# Patient Record
Sex: Female | Born: 1950 | Hispanic: No | Marital: Married | Smoking: Never smoker
Health system: Southern US, Community
[De-identification: ages and names within clinical notes are randomized; demographics above are authoritative.]

## PROBLEM LIST (undated history)

## (undated) DIAGNOSIS — R0683 Snoring: Secondary | ICD-10-CM

## (undated) DIAGNOSIS — J45909 Unspecified asthma, uncomplicated: Secondary | ICD-10-CM

## (undated) DIAGNOSIS — Z91018 Allergy to other foods: Secondary | ICD-10-CM

## (undated) DIAGNOSIS — L718 Other rosacea: Secondary | ICD-10-CM

## (undated) DIAGNOSIS — K219 Gastro-esophageal reflux disease without esophagitis: Secondary | ICD-10-CM

## (undated) DIAGNOSIS — J309 Allergic rhinitis, unspecified: Secondary | ICD-10-CM

## (undated) HISTORY — DX: Unspecified asthma, uncomplicated: J45.909

## (undated) HISTORY — DX: Allergic rhinitis, unspecified: J30.9

## (undated) HISTORY — DX: Allergy to other foods: Z91.018

## (undated) HISTORY — PX: TONSILLECTOMY: SUR1361

## (undated) HISTORY — PX: ADENOIDECTOMY: SUR15

## (undated) HISTORY — DX: Gastro-esophageal reflux disease without esophagitis: K21.9

## (undated) HISTORY — DX: Other rosacea: L71.8

## (undated) HISTORY — DX: Snoring: R06.83

---

## 1972-02-11 HISTORY — PX: APPENDECTOMY: SHX54

## 2000-02-11 HISTORY — PX: SINOSCOPY: SHX187

## 2001-02-10 HISTORY — PX: KNEE SURGERY: SHX244

## 2009-09-14 ENCOUNTER — Encounter (INDEPENDENT_AMBULATORY_CARE_PROVIDER_SITE_OTHER): Payer: Self-pay | Admitting: Emergency Medicine

## 2009-09-14 ENCOUNTER — Ambulatory Visit: Payer: Self-pay | Admitting: Vascular Surgery

## 2009-09-14 ENCOUNTER — Emergency Department (HOSPITAL_COMMUNITY): Admission: EM | Admit: 2009-09-14 | Discharge: 2009-09-14 | Payer: Self-pay | Admitting: Emergency Medicine

## 2009-12-05 ENCOUNTER — Ambulatory Visit (HOSPITAL_COMMUNITY)
Admission: RE | Admit: 2009-12-05 | Discharge: 2009-12-05 | Payer: Self-pay | Source: Home / Self Care | Admitting: Obstetrics and Gynecology

## 2010-02-10 HISTORY — PX: ABDOMINAL HYSTERECTOMY: SHX81

## 2010-03-11 LAB — CBC
MCH: 29.2 pg (ref 26.0–34.0)
MCHC: 33.1 g/dL (ref 30.0–36.0)
MCV: 88.2 fL (ref 78.0–100.0)
Platelets: 301 10*3/uL (ref 150–400)
RDW: 12.5 % (ref 11.5–15.5)

## 2010-03-18 ENCOUNTER — Ambulatory Visit (HOSPITAL_COMMUNITY)
Admission: RE | Admit: 2010-03-18 | Discharge: 2010-03-19 | Disposition: A | Discharge status: Home / Self Care | Payer: Medicare HMO | Source: Ambulatory Visit | Attending: Obstetrics and Gynecology | Admitting: Obstetrics and Gynecology

## 2010-03-18 ENCOUNTER — Other Ambulatory Visit (HOSPITAL_COMMUNITY): Payer: Self-pay | Admitting: Obstetrics and Gynecology

## 2010-03-18 DIAGNOSIS — N920 Excessive and frequent menstruation with regular cycle: Secondary | ICD-10-CM | POA: Insufficient documentation

## 2010-03-18 DIAGNOSIS — N8000 Endometriosis of the uterus, unspecified: Secondary | ICD-10-CM | POA: Insufficient documentation

## 2010-03-18 DIAGNOSIS — J45909 Unspecified asthma, uncomplicated: Secondary | ICD-10-CM | POA: Insufficient documentation

## 2010-03-18 DIAGNOSIS — N946 Dysmenorrhea, unspecified: Secondary | ICD-10-CM | POA: Insufficient documentation

## 2010-03-18 DIAGNOSIS — N83209 Unspecified ovarian cyst, unspecified side: Secondary | ICD-10-CM | POA: Insufficient documentation

## 2010-03-19 LAB — CBC
HCT: 27.4 % — ABNORMAL LOW (ref 36.0–46.0)
Hemoglobin: 9 g/dL — ABNORMAL LOW (ref 12.0–15.0)
MCV: 89 fL (ref 78.0–100.0)
Platelets: 228 10*3/uL (ref 150–400)
RBC: 3.08 MIL/uL — ABNORMAL LOW (ref 3.87–5.11)
WBC: 9.5 10*3/uL (ref 4.0–10.5)

## 2010-03-21 NOTE — Op Note (Signed)
Misty Scott, Misty Scott                ACCOUNT NO.:  [id number]  MEDICAL RECORD NO.:  [id number]           PATIENT TYPE:  I  LOCATION:  9320                          FACILITY:  [facility]  PHYSICIAN:  Zelphia Cairo, MD    DATE OF BIRTH:  [birth year]  DATE OF PROCEDURE:  03/18/2010 DATE OF DISCHARGE:                              OPERATIVE REPORT   PREOPERATIVE DIAGNOSES: 1. Menorrhagia. 2. Dysmenorrhea.  POSTOPERATIVE DIAGNOSES: 1. Menorrhagia 2. Dysmenorrhea. 3. Path pending.  PROCEDURE:  Laparoscopic-assisted vaginal hysterectomy.  SURGEON:  Zelphia Cairo, MD  ASSISTANT:  Dineen Kid. Rana Snare, MD  ANESTHESIA:  General.  URINE OUTPUT:  Clear.  ESTIMATED BLOOD LOSS:  300 mL.  COMPLICATIONS:  None.  CONDITION:  Stable to recovery room.  PROCEDURES:  Misty Scott was taken to the operating room with IV running. She was placed in the supine position where general anesthesia was obtained.  She was placed in the dorsal lithotomy position using Allen stirrups and prepped and draped in sterile fashion.  A Foley catheter was inserted sterilely.  A bivalve speculum was placed in the vagina and a single-tooth tenaculum placed on the anterior lip of the cervix.  A Hulka clamp was placed through the cervix and attached to the anterior lip.  Single-tooth tenaculum and speculum removed and our attention was turned to the abdomen.  After injection of local anesthesia, an infraumbilical skin incision was made with the scalpel.  This was extended bluntly to the level of the fascia using a Kelly clamp.  Optical trocar was inserted under direct visualization and CO2 was turned on.  The abdomen and pelvis were insufflated.  A survey of the abdomen and pelvis revealed of boggy- appearing uterus without masses.  Bilateral fallopian tubes and ovaries appeared normal.  No ovarian cysts or masses were noted bilaterally. The patient was placed in Trendelenburg position and the bowel moved out of the  posterior cul-de-sac.  The uterus was manipulated to the patient's left side.  The enseal device was used to grasp, cauterize, and cut the right fallopian tube.  The right utero-ovarian ligament and broad ligament were then grasped, cut, and cauterized using the EnSeal. This incision was extended staying just adjacent to the uterus.  The round ligament was grasped, cauterized, and cut with the EnSeal.  Once hemostasis was assured on the right, this procedure was repeated on the left side.  The left fallopian tube, utero-ovarian ligament, and broad ligament to the level of the round ligament were grasped, cut, cauterized with the EnSeal device.  Good hemostasis was noted bilaterally.  All instruments were removed from the trocar.  CO2 was turned off and our attention was turned to the vagina.  A Hulka clamp was removed and a weighted speculum was placed in the vagina.  A Deaver was placed in the anterior vagina and the cervix was grasped with a tenaculum.  The cervix was then circumferentially incised with the Bovie and the bladder was dissected off the pubovesical cervical fascia anteriorly using a dry and moist lap sponge.  The posterior cul-de-sac was entered sharply using curved Mayo scissors and a long-weighted speculum  was placed in the posterior cul-de-sac.  Heaney clamps were placed over the uterosacral ligaments bilaterally.  These were then transected and suture ligated with 0 Monocryl.  Hemostasis was assured.  Cardinal ligaments were then clamped bilaterally, transected, and suture ligated in similar fashion.  Uterine arteries and broad ligaments were then serially clamped with Heaney clamps, transected, and suture ligated bilaterally.  Excellent hemostasis was visualized.  The cornua and uterus were then delivered through the posterior cul-de-sac. Any remaining pedicles were grasped, cut, and suture ligated.  The posterior vaginal cuff was then reapproximated with the  posterior peritoneum using a running locked stitch.  Once hemostasis was assured, the long-weighted speculum was traded for the short-weighted speculum and the remainder of the vaginal cuff was closed using figure-of-eight stitches in an interrupted fashion.  All instruments were removed from the vagina and our attention was then returned to the abdomen.  The CO2 was turned back on and all pedicles and the vaginal cuff were reinspected and noted to be hemostatic.  No evidence of bleeding or clots were noted within the pelvis.  The trocar and instruments were removed from the abdominal incision.  A deep stitch was placed in the infraumbilical incision and the skin was reapproximated using Vicryl in an interrupted stitch.  Dermabond was placed over the abdominal incision.  The patient tolerated the procedure well.  Sponge, lap, instrument, and needle counts were correct x2.  She was taken to the recovery room in stable condition.     Zelphia Cairo, MD     GA/MEDQ  D:  03/18/2010  T:  03/19/2010  Job:  [id number]  Electronically Signed by Zelphia Cairo MD on 03/21/2010 01:30:15 PM

## 2010-04-24 LAB — CBC
HCT: 32.9 % — ABNORMAL LOW (ref 36.0–46.0)
Hemoglobin: 11.5 g/dL — ABNORMAL LOW (ref 12.0–15.0)
MCH: 34.2 pg — ABNORMAL HIGH (ref 26.0–34.0)
MCHC: 35.1 g/dL (ref 30.0–36.0)
MCV: 97.4 fL (ref 78.0–100.0)
Platelets: 229 K/uL (ref 150–400)
RBC: 3.38 MIL/uL — ABNORMAL LOW (ref 3.87–5.11)
RDW: 13.7 % (ref 11.5–15.5)
WBC: 6.7 K/uL (ref 4.0–10.5)

## 2011-08-25 ENCOUNTER — Other Ambulatory Visit: Payer: Self-pay | Admitting: Gastroenterology

## 2011-08-25 DIAGNOSIS — R109 Unspecified abdominal pain: Secondary | ICD-10-CM

## 2011-08-26 ENCOUNTER — Ambulatory Visit
Admission: RE | Admit: 2011-08-26 | Discharge: 2011-08-26 | Disposition: A | Discharge status: Home / Self Care | Payer: Medicare HMO | Source: Ambulatory Visit | Attending: Gastroenterology | Admitting: Gastroenterology

## 2011-08-26 DIAGNOSIS — R109 Unspecified abdominal pain: Secondary | ICD-10-CM

## 2011-08-26 MED ORDER — IOHEXOL 300 MG/ML  SOLN
100.0000 mL | Freq: Once | INTRAMUSCULAR | Status: AC | PRN
  Administered 2011-08-26: 100 mL via INTRAVENOUS

## 2012-08-24 ENCOUNTER — Other Ambulatory Visit: Payer: Self-pay | Admitting: Obstetrics and Gynecology

## 2012-08-24 DIAGNOSIS — N63 Unspecified lump in unspecified breast: Secondary | ICD-10-CM

## 2012-08-24 DIAGNOSIS — N644 Mastodynia: Secondary | ICD-10-CM

## 2012-09-02 ENCOUNTER — Ambulatory Visit
Admission: RE | Admit: 2012-09-02 | Discharge: 2012-09-02 | Disposition: A | Discharge status: Home / Self Care | Payer: Medicare HMO | Source: Ambulatory Visit | Attending: Obstetrics and Gynecology | Admitting: Obstetrics and Gynecology

## 2012-09-02 ENCOUNTER — Other Ambulatory Visit: Payer: Self-pay | Admitting: Obstetrics and Gynecology

## 2012-09-02 DIAGNOSIS — N63 Unspecified lump in unspecified breast: Secondary | ICD-10-CM

## 2012-09-02 DIAGNOSIS — N644 Mastodynia: Secondary | ICD-10-CM

## 2013-09-20 ENCOUNTER — Other Ambulatory Visit: Payer: Self-pay | Admitting: Ophthalmology

## 2013-09-20 DIAGNOSIS — H5340 Unspecified visual field defects: Secondary | ICD-10-CM

## 2013-09-27 ENCOUNTER — Other Ambulatory Visit: Payer: Medicare HMO

## 2013-12-07 ENCOUNTER — Ambulatory Visit
Admission: RE | Admit: 2013-12-07 | Discharge: 2013-12-07 | Disposition: A | Discharge status: Home / Self Care | Payer: 59 | Source: Ambulatory Visit | Attending: Ophthalmology | Admitting: Ophthalmology

## 2013-12-07 DIAGNOSIS — H5340 Unspecified visual field defects: Secondary | ICD-10-CM

## 2014-09-21 ENCOUNTER — Other Ambulatory Visit: Payer: Self-pay | Admitting: Obstetrics and Gynecology

## 2014-09-22 LAB — CYTOLOGY - PAP

## 2014-10-02 ENCOUNTER — Other Ambulatory Visit: Payer: Self-pay | Admitting: Obstetrics and Gynecology

## 2014-10-02 DIAGNOSIS — N6489 Other specified disorders of breast: Secondary | ICD-10-CM

## 2014-10-02 DIAGNOSIS — R198 Other specified symptoms and signs involving the digestive system and abdomen: Secondary | ICD-10-CM

## 2014-10-02 DIAGNOSIS — N644 Mastodynia: Secondary | ICD-10-CM

## 2014-10-05 ENCOUNTER — Other Ambulatory Visit: Payer: 59

## 2014-10-06 ENCOUNTER — Other Ambulatory Visit: Payer: Self-pay | Admitting: Obstetrics and Gynecology

## 2014-10-06 ENCOUNTER — Ambulatory Visit
Admission: RE | Admit: 2014-10-06 | Discharge: 2014-10-06 | Disposition: A | Discharge status: Home / Self Care | Payer: Managed Care, Other (non HMO) | Source: Ambulatory Visit | Attending: Obstetrics and Gynecology | Admitting: Obstetrics and Gynecology

## 2014-10-06 DIAGNOSIS — N6489 Other specified disorders of breast: Secondary | ICD-10-CM

## 2014-10-06 DIAGNOSIS — R198 Other specified symptoms and signs involving the digestive system and abdomen: Secondary | ICD-10-CM

## 2014-10-06 DIAGNOSIS — N644 Mastodynia: Secondary | ICD-10-CM

## 2015-02-14 ENCOUNTER — Encounter: Payer: Self-pay | Admitting: Allergy and Immunology

## 2015-02-14 ENCOUNTER — Ambulatory Visit (INDEPENDENT_AMBULATORY_CARE_PROVIDER_SITE_OTHER): Payer: Managed Care, Other (non HMO) | Admitting: Allergy and Immunology

## 2015-02-14 VITALS — BP 122/70 | HR 72 | Resp 20

## 2015-02-14 DIAGNOSIS — J387 Other diseases of larynx: Secondary | ICD-10-CM | POA: Diagnosis not present

## 2015-02-14 DIAGNOSIS — J4520 Mild intermittent asthma, uncomplicated: Secondary | ICD-10-CM | POA: Diagnosis not present

## 2015-02-14 DIAGNOSIS — K219 Gastro-esophageal reflux disease without esophagitis: Secondary | ICD-10-CM | POA: Insufficient documentation

## 2015-02-14 DIAGNOSIS — J3089 Other allergic rhinitis: Secondary | ICD-10-CM | POA: Diagnosis not present

## 2015-02-14 DIAGNOSIS — R0683 Snoring: Secondary | ICD-10-CM | POA: Diagnosis not present

## 2015-02-14 DIAGNOSIS — Z91018 Allergy to other foods: Secondary | ICD-10-CM

## 2015-02-14 MED ORDER — BECLOMETHASONE DIPROPIONATE 80 MCG/ACT IN AERS: INHALATION_SPRAY | RESPIRATORY_TRACT | Status: DC

## 2015-02-14 MED ORDER — RANITIDINE HCL 300 MG PO CAPS: 300.0000 mg | ORAL_CAPSULE | Freq: Every evening | ORAL | Status: DC

## 2015-02-14 MED ORDER — OMEPRAZOLE 40 MG PO CPDR: 40.0000 mg | DELAYED_RELEASE_CAPSULE | Freq: Every day | ORAL | Status: DC

## 2015-02-14 NOTE — Progress Notes (Signed)
[facility]  Follow-up Note  Referring Provider: No ref. provider found Primary Provider: Horatio Pel, MD Date of Office Visit: 02/14/2015  Subjective:   Misty Scott is a 65 y.o. female who returns to the [facility] in re-evaluation of the following:  HPI Comments:  Misty Scott returns to this clinic on 02/14/2015 in reevaluation of her LPR, allergic rhinitis, intermittent asthma, snoring, and possible food allergy. Overall she's had a good 6 month interval since I've last seen her in his clinic. She's had very good control of her reflux disease while currently using her medical therapy and she's had very little problems with her throat and very little problems with her nose while using nasal fluticasone. She's lost a possibly 11 pounds of weight. This late fall for about 6 weeks she did have problems with nocturnal asthma with wheezing and coughing for which she had use of bronchodilator almost every day. Fortunately, over the course of the past 4 weeks this has resolved. She does not have a history of exercise-induced bronchospastic symptoms. There was no obvious trigger for this flareup. There did not appear to be any viral respiratory tract infection involving her upper airways associated with this flare. Her snoring appears to be stable and there is no associated apneic symptoms associated with her snoring.   No current outpatient prescriptions on file prior to visit.   No current facility-administered medications on file prior to visit.    Meds ordered this encounter  Medications  . ranitidine (ZANTAC) 300 MG capsule    Sig: Take 1 capsule (300 mg total) by mouth every evening.    Dispense:  30 capsule    Refill:  5  . omeprazole (PRILOSEC) 40 MG capsule    Sig: Take 1 capsule (40 mg total) by mouth daily.    Dispense:  30 capsule    Refill:  5    No past medical history on file.  No past  surgical history on file.  Allergies  Allergen Reactions  . Avelox [Moxifloxacin Hcl In Nacl]   . Nsaids     Review of systems negative except as noted in HPI / PMHx or noted below:  Review of Systems  Constitutional: Negative.   HENT: Negative.   Eyes: Negative.   Respiratory: Negative.   Cardiovascular: Negative.   Gastrointestinal: Negative.   Genitourinary: Negative.   Musculoskeletal: Negative.   Skin: Negative.   Neurological: Negative.   Endo/Heme/Allergies: Negative.   Psychiatric/Behavioral: Negative.      Objective:   Filed Vitals:   02/14/15 1128  BP: 122/70  Pulse: 72  Resp: 20          Physical Exam  Constitutional: She is well-developed, well-nourished, and in no distress. No distress.  HENT:  Head: Normocephalic.  Right Ear: Tympanic membrane, external ear and ear canal normal.  Left Ear: Tympanic membrane, external ear and ear canal normal.  Nose: Nose normal. No mucosal edema or rhinorrhea.  Mouth/Throat: Uvula is midline, oropharynx is clear and moist and mucous membranes are normal. No oropharyngeal exudate.  Eyes: Conjunctivae are normal.  Neck: Trachea normal. No tracheal tenderness present. No tracheal deviation present. No thyromegaly present.  Cardiovascular: Normal rate, regular rhythm, S1 normal, S2 normal and normal heart sounds.   No murmur heard. Pulmonary/Chest: Breath sounds normal. No stridor. No respiratory distress. She has no wheezes. She has no rales.  Musculoskeletal: She exhibits no edema.  Lymphadenopathy:  Head (right side): No tonsillar adenopathy present.       Head (left side): No tonsillar adenopathy present.    She has no cervical adenopathy.    She has no axillary adenopathy.  Neurological: She is alert. Gait normal.  Skin: No rash noted. She is not diaphoretic. No erythema. Nails show no clubbing.  Psychiatric: Mood and affect normal.    Diagnostics:    Spirometry was performed and demonstrated an FEV1  of 2.96 at 134 % of predicted.   Assessment and Plan:   1. Mild intermittent asthma, uncomplicated   2. LPRD (laryngopharyngeal reflux disease)   3. Other allergic rhinitis   4. History of food allergy   5. Snoring      1. Continue combination of omeprazole 40 mg in a.m. plus ranitidine 300 mg in PM  2. Continue nasal fluticasone one-2 sprays each nostril daily  3. Continue Proventil HFA 2 puffs every 4-6 hours if needed  4. "Action plan" for asthma flare up:   A. start Qvar 80 3 inhalations 3 times per day with spacer  B. continue Proventil HFA 2 puffs every 4-6 hours if needed  5. Continue EpiPen if needed  6. Continue weight loss and exercise program  7. Return to clinic in 6 months or earlier if problem  Overall Misty Scott is done quite well and we'll continue to have her use the therapy mentioned above with the addition of an action plan that she can initiate whenever she develops an asthma flare in the future. I think that if she continues to undergo weight loss program and exercise program she's going to do much better overall as we move through the next 6 months. She will contact me during the interval should there be a significant problem but otherwise we'll just regroup with each other in 6 months.  Allena Katz, MD [facility]

## 2015-02-14 NOTE — Patient Instructions (Signed)
  1. Continue combination of omeprazole 40 mg in a.m. plus ranitidine 300 mg in PM  2. Continue nasal fluticasone one-2 sprays each nostril daily  3. Continue Proventil HFA 2 puffs every 4-6 hours if needed  4. "Action plan" for asthma flare up:   A. start Qvar 80 3 inhalations 3 times per day with spacer  B. continue Proventil HFA 2 puffs every 4-6 hours if needed  5. Continue EpiPen if needed  6. Continue weight loss and exercise program  7. Return to clinic in 6 months or earlier if problem

## 2015-04-23 ENCOUNTER — Other Ambulatory Visit: Payer: Self-pay | Admitting: *Deleted

## 2015-04-23 MED ORDER — OMEPRAZOLE 40 MG PO CPDR: 40.0000 mg | DELAYED_RELEASE_CAPSULE | Freq: Every day | ORAL | Status: AC

## 2015-04-23 MED ORDER — BECLOMETHASONE DIPROPIONATE 80 MCG/ACT IN AERS: INHALATION_SPRAY | RESPIRATORY_TRACT | Status: DC

## 2015-07-02 ENCOUNTER — Other Ambulatory Visit: Payer: Self-pay

## 2015-07-02 MED ORDER — RANITIDINE HCL 300 MG PO CAPS: 300.0000 mg | ORAL_CAPSULE | Freq: Every evening | ORAL | Status: AC

## 2015-07-04 ENCOUNTER — Telehealth: Payer: Self-pay

## 2015-07-04 ENCOUNTER — Other Ambulatory Visit: Payer: Self-pay

## 2015-07-04 NOTE — Telephone Encounter (Signed)
Faxed one 90-Day refill of Ranitidine 300mg - [facility] to [facility].

## 2015-07-04 NOTE — Telephone Encounter (Signed)
Faxed one 90-day refill for Ranitidine 300mg - [facility] to [facility].

## 2015-08-22 ENCOUNTER — Ambulatory Visit: Payer: Managed Care, Other (non HMO) | Admitting: Allergy and Immunology

## 2015-09-25 ENCOUNTER — Encounter (INDEPENDENT_AMBULATORY_CARE_PROVIDER_SITE_OTHER): Payer: Self-pay

## 2015-09-25 ENCOUNTER — Ambulatory Visit (INDEPENDENT_AMBULATORY_CARE_PROVIDER_SITE_OTHER): Payer: Managed Care, Other (non HMO) | Admitting: Allergy and Immunology

## 2015-09-25 ENCOUNTER — Encounter: Payer: Self-pay | Admitting: Allergy and Immunology

## 2015-09-25 VITALS — BP 122/84 | HR 60 | Resp 20 | Ht 60.67 in | Wt 159.6 lb

## 2015-09-25 DIAGNOSIS — Z91018 Allergy to other foods: Secondary | ICD-10-CM

## 2015-09-25 DIAGNOSIS — J4520 Mild intermittent asthma, uncomplicated: Secondary | ICD-10-CM | POA: Diagnosis not present

## 2015-09-25 DIAGNOSIS — J387 Other diseases of larynx: Secondary | ICD-10-CM

## 2015-09-25 DIAGNOSIS — R0683 Snoring: Secondary | ICD-10-CM

## 2015-09-25 DIAGNOSIS — J3089 Other allergic rhinitis: Secondary | ICD-10-CM

## 2015-09-25 DIAGNOSIS — K219 Gastro-esophageal reflux disease without esophagitis: Secondary | ICD-10-CM

## 2015-09-25 MED ORDER — EPINEPHRINE 0.3 MG/0.3ML IJ SOAJ: INTRAMUSCULAR | 3 refills | Status: AC

## 2015-09-25 NOTE — Progress Notes (Signed)
Follow-up Note  Referring Provider: Deland Pretty, MD Primary Provider: Horatio Pel, MD Date of Office Visit: 09/25/2015  Subjective:   Misty Scott (DOB: [birth year]) is a 65 y.o. female who returns to the [facility] on 09/25/2015 in re-evaluation of the following:  HPI: Misty Scott returns to this clinic in reevaluation of her allergic rhinitis, intermittent asthma, reflux-induced respiratory disease, and history of food allergy directed again shellfish. I last saw her in his clinic in January 2017  During the interval she's had no problems with her asthma at all. She can exercise without any difficulty. She has lost a total of 21 pounds since last August. She does not need to use a short acting bronchodilator and has not had to activate an action plan and has not required a systemic steroid.  Her upper airways are doing very well. She uses Flonase every day. She is not required an antibiotic to treat an episode of sinusitis.  She's had very little problems with reflux while consistently using a combination of a proton pump inhibitor and H2 receptor blocker. She has eliminated consumption of all red wine but still has one glass of Braxton wine most nights and still has 1 cup of coffee per day.  She remains away from shellfish.  Her snoring has decreased dramatically since she has lost 21 pounds of weight    Medication List      aspirin 81 MG tablet Take 81 mg by mouth daily.   beclomethasone 80 MCG/ACT inhaler Commonly known as:  QVAR Use 3 puffs 3 times as needed for asthma flare ups.   EPIPEN 2-PAK 0.3 mg/0.3 mL Soaj injection Generic drug:  EPINEPHrine Inject into the muscle once.   Fish Oil 500 MG Caps Take by mouth.   fluticasone 50 MCG/ACT nasal spray Commonly known as:  FLONASE Place 2 sprays into both nostrils daily.   lubiprostone 8 MCG capsule Commonly known as:  AMITIZA Take 8 mcg by mouth 2 (two) times daily with a meal.     omeprazole 40 MG capsule Commonly known as:  PRILOSEC Take 1 capsule (40 mg total) by mouth daily.   PROVENTIL HFA 108 (90 Base) MCG/ACT inhaler Generic drug:  albuterol Inhale 2 puffs into the lungs every 4 (four) hours as needed for wheezing or shortness of breath.   ranitidine 300 MG capsule Commonly known as:  ZANTAC Take 1 capsule (300 mg total) by mouth every evening.   Vitamin D-3 1000 units Caps Take by mouth.       Past Medical History:  Diagnosis Date  . Allergic rhinitis   . Asthma   . Food allergy    Shellfish  . LPRD (laryngopharyngeal reflux disease)   . Ocular rosacea   . Snoring     Past Surgical History:  Procedure Laterality Date  . ABDOMINAL HYSTERECTOMY  2012  . ADENOIDECTOMY    . APPENDECTOMY  1974  . CESAREAN SECTION  1987  . KNEE SURGERY Left 2003  . SINOSCOPY  2002  . TONSILLECTOMY      Allergies  Allergen Reactions  . Avelox [Moxifloxacin Hcl In Nacl]   . Nsaids   . Tolmetin Other (See Comments)    Review of systems negative except as noted in HPI / PMHx or noted below:  Review of Systems  Constitutional: Negative.   HENT: Negative.   Eyes: Negative.   Respiratory: Negative.   Cardiovascular: Negative.   Gastrointestinal: Negative.   Genitourinary: Negative.  Musculoskeletal: Negative.   Skin: Negative.   Neurological: Negative.   Endo/Heme/Allergies: Negative.   Psychiatric/Behavioral: Negative.      Objective:   Vitals:   09/25/15 1400  BP: 122/84  Pulse: 60  Resp: 20   Height: 5' 0.67" (154.1 cm)  Weight: 159 lb 9.6 oz (72.4 kg)   Physical Exam  Constitutional: She is well-developed, well-nourished, and in no distress.  HENT:  Head: Normocephalic.  Right Ear: Tympanic membrane, external ear and ear canal normal.  Left Ear: Tympanic membrane, external ear and ear canal normal.  Nose: Nose normal. No mucosal edema or rhinorrhea.  Mouth/Throat: Uvula is midline, oropharynx is clear and moist and mucous  membranes are normal. No oropharyngeal exudate.  Eyes: Conjunctivae are normal.  Neck: Trachea normal. No tracheal tenderness present. No tracheal deviation present. No thyromegaly present.  Cardiovascular: Normal rate, regular rhythm, S1 normal, S2 normal and normal heart sounds.   No murmur heard. Pulmonary/Chest: Breath sounds normal. No stridor. No respiratory distress. She has no wheezes. She has no rales.  Musculoskeletal: She exhibits no edema.  Lymphadenopathy:       Head (right side): No tonsillar adenopathy present.       Head (left side): No tonsillar adenopathy present.    She has no cervical adenopathy.  Neurological: She is alert. Gait normal.  Skin: No rash noted. She is not diaphoretic. No erythema. Nails show no clubbing.  Psychiatric: Mood and affect normal.    Diagnostics:    Spirometry was performed and demonstrated an FEV1 of 2.93 at 138 % of predicted.  The patient had an Asthma Control Test with the following results:  .    Assessment and Plan:   1. Mild intermittent asthma, uncomplicated   2. Other allergic rhinitis   3. History of food allergy   4. LPRD (laryngopharyngeal reflux disease)   5. Snoring     1. Continue combination of omeprazole 40 mg in a.m. plus ranitidine 300 mg in PM  2. Continue nasal fluticasone one-2 sprays each nostril daily  3. Continue Proventil HFA 2 puffs every 4-6 hours if needed  4. "Action plan" for asthma flare up:   A. start Qvar 80 3 inhalations 3 times per day with spacer  B. continue Proventil HFA 2 puffs every 4-6 hours if needed  5. Continue EpiPen and if needed  6. Continue weight loss and exercise program  7. Return to clinic in 1 year or earlier if problem  8. Obtain fall flu vaccine  Misty Scott is doing quite well at this point in time most likely because she is careful about her exposures, has lost a fair amount of weight the past year, and has been consistently using medical therapy directed against reflux  and upper airway inflammation. At this point we'll continue to have her use the therapy mentioned above and she can activate an action plan should she ever develop an asthma flare in the future and can also use EpiPen if she ever develops another allergic reaction in the future. I will see her back in this clinic in 1 year or earlier if there is a problem.  Allena Katz, MD [facility]

## 2015-09-25 NOTE — Patient Instructions (Addendum)
  1. Continue combination of omeprazole 40 mg in a.m. plus ranitidine 300 mg in PM  2. Continue nasal fluticasone one-2 sprays each nostril daily  3. Continue Proventil HFA 2 puffs every 4-6 hours if needed  4. "Action plan" for asthma flare up:   A. start Qvar 80 3 inhalations 3 times per day with spacer  B. continue Proventil HFA 2 puffs every 4-6 hours if needed  5. Continue EpiPen and if needed  6. Continue weight loss and exercise program  7. Return to clinic in 1 year or earlier if problem  8. Obtain fall flu vaccine

## 2015-12-25 ENCOUNTER — Other Ambulatory Visit: Payer: Self-pay | Admitting: Allergy and Immunology

## 2016-08-14 ENCOUNTER — Other Ambulatory Visit: Payer: Self-pay | Admitting: *Deleted

## 2016-08-14 MED ORDER — FLUTICASONE PROPIONATE HFA 110 MCG/ACT IN AERO: INHALATION_SPRAY | RESPIRATORY_TRACT | 0 refills | Status: AC

## 2016-12-23 IMAGING — MG MM DIAG BREAST TOMO UNI LEFT
5 series · 6 of 13 positions shown · non-contrast
Comparison: Previous exam(s).

CLINICAL DATA: 64-year-old female with an area of palpable concern
in the upper-outer left breast.

EXAM:
DIGITAL DIAGNOSTIC LEFT MAMMOGRAM WITH 3D TOMOSYNTHESIS AND CAD
LEFT BREAST ULTRASOUND

[L TAN]
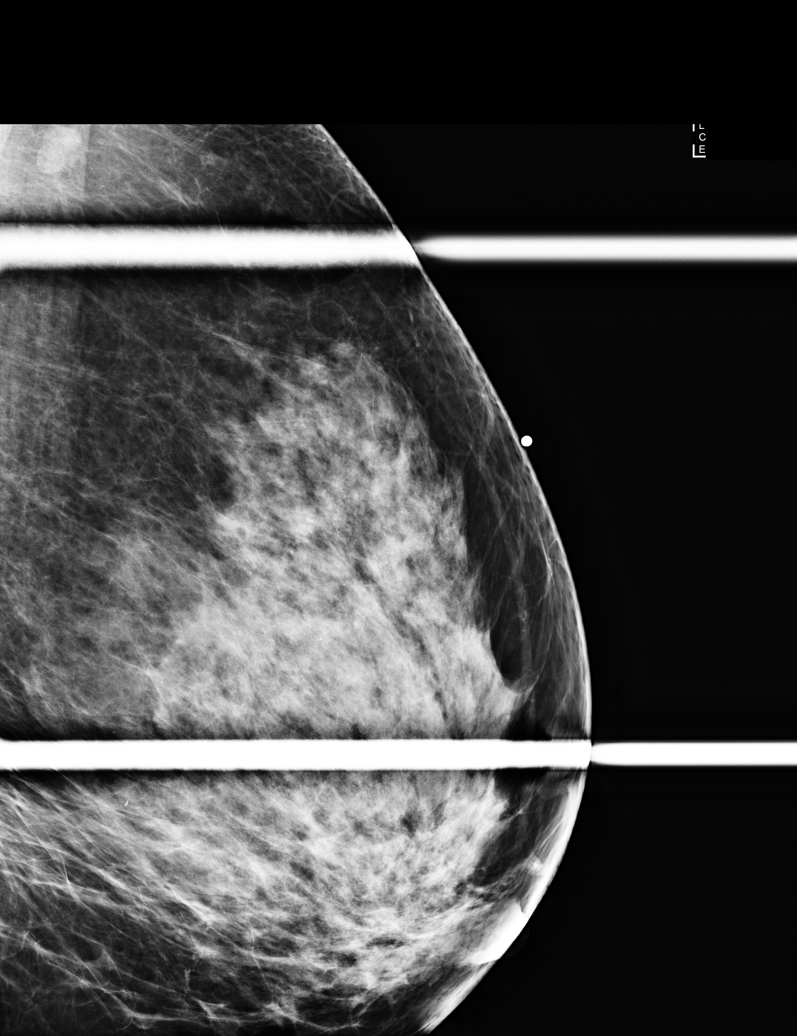

[L CC]
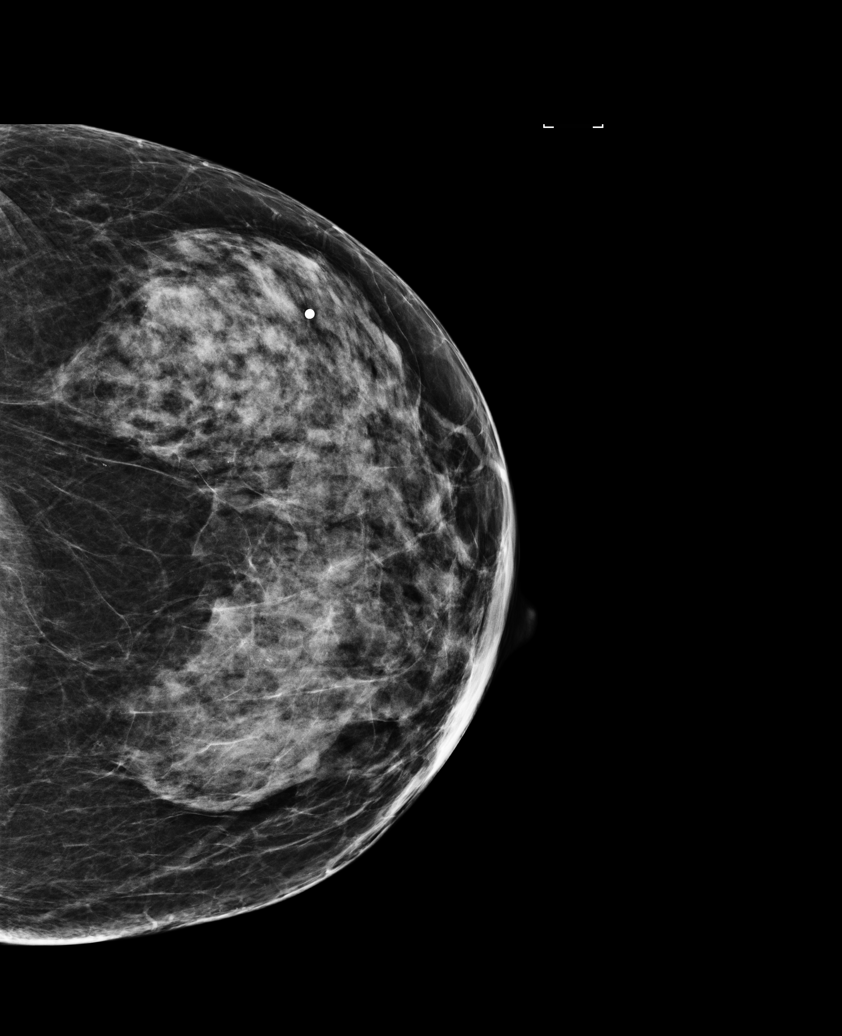

[L MLO]
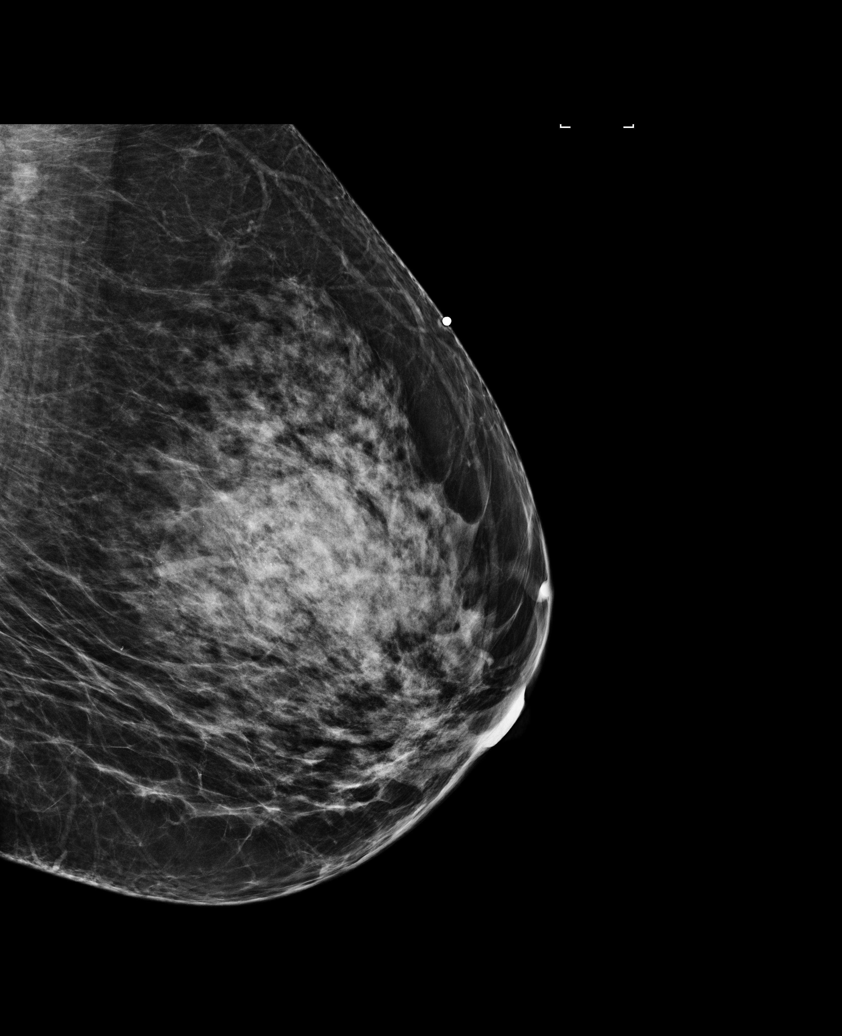

[L MLO tomo · 2 of 71 frames shown]
[frame 23/71]
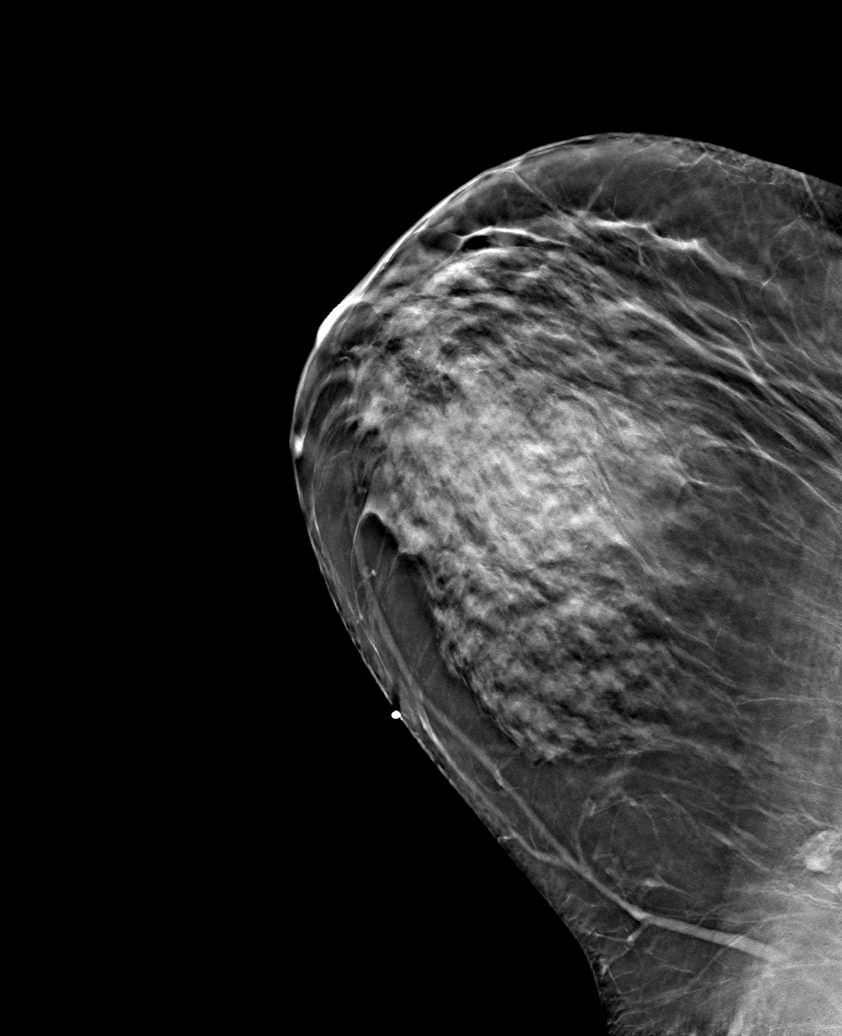
[frame 36/71]
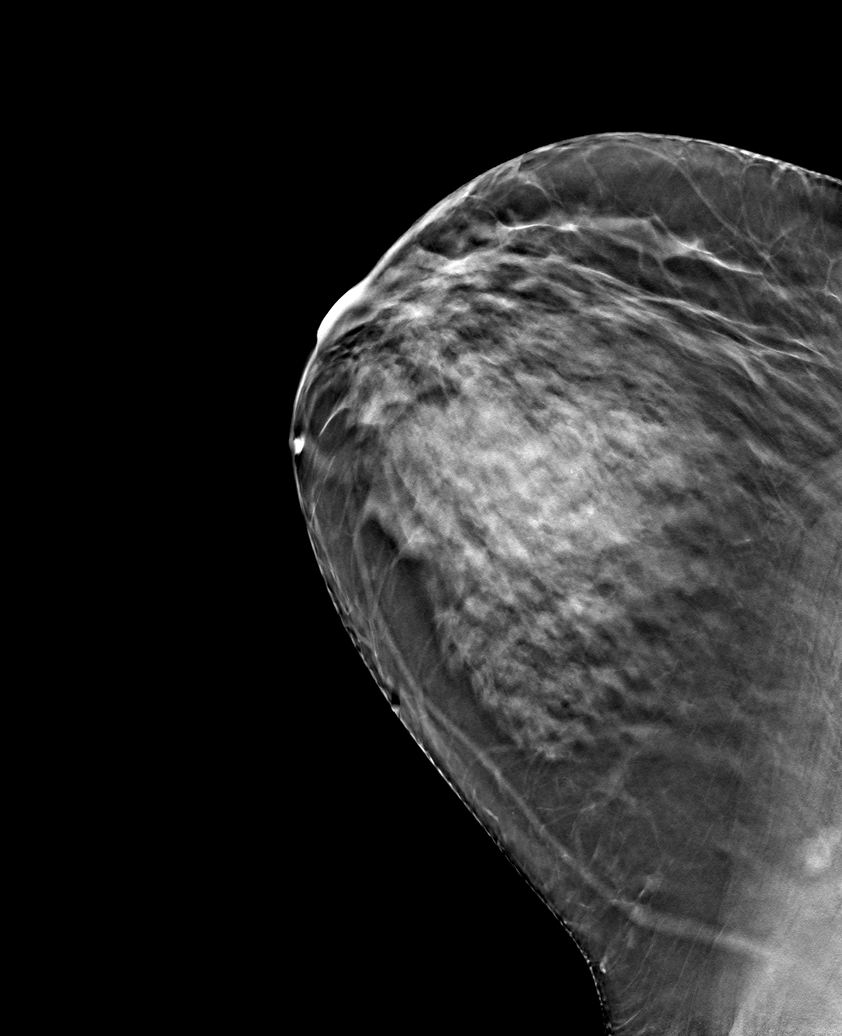

[L CC tomo · tomo slice 35/70.0]
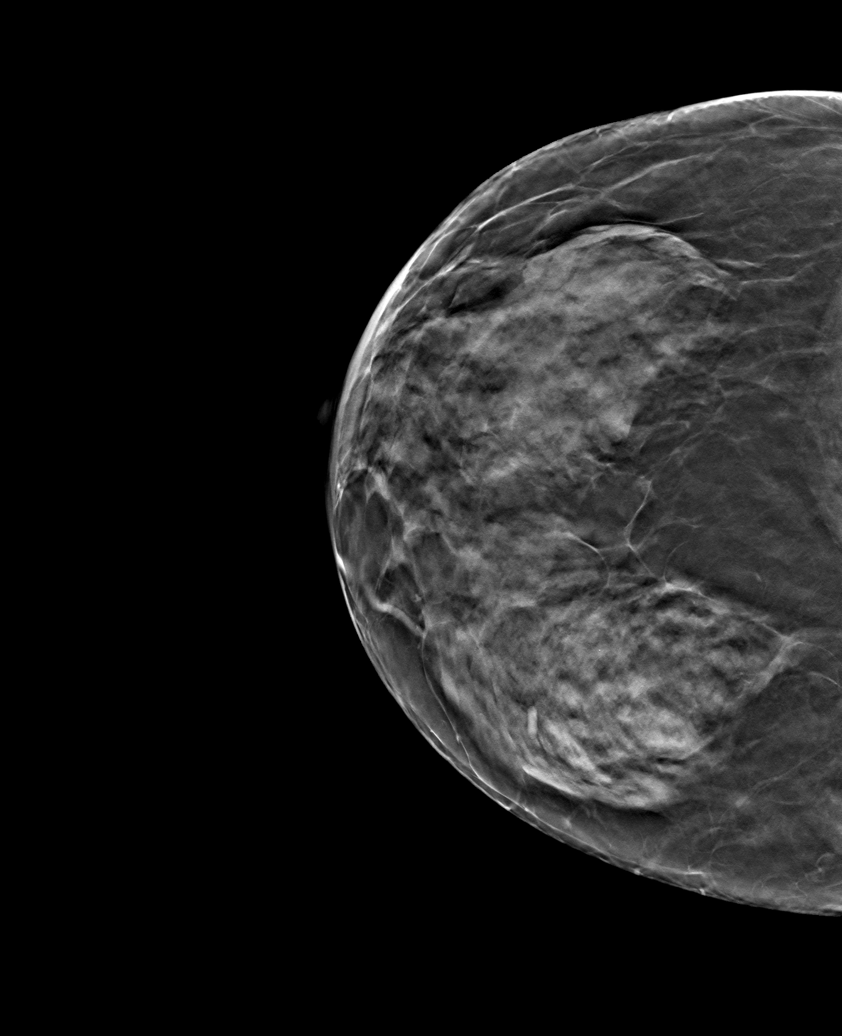

[6 of 13 positions shown; findings below may reference images not displayed]

ACR Breast Density Category c: The breast tissue is heterogeneously
dense, which may obscure small masses.
FINDINGS: No suspicious masses or calcifications are seen in the left breast.
Spot compression tangential view was performed over the palpable
site of concern in the upper-outer left breast with no mammographic
abnormalities seen in this location.

Mammographic images were processed with CAD.

Physical examination at site of concern in the upper-outer left
breast reveals a generalized area of thickening.

Targeted ultrasound of the left breast was performed. No discrete
masses or abnormalities are seen, only extremely dense
fibroglandular tissue is visualized.
IMPRESSION: 1. No mammographic or sonographic correlate for the area of palpable
concern in the upper-outer left breast.

2. No mammographic evidence of malignancy in the left breast.

RECOMMENDATION:
1. Recommend further management of the left breast palpable
abnormality be based on clinical assessment.

2.  Screening mammogram in one year.(Code:NL-H-V10)

I have discussed the findings and recommendations with the patient.
Results were also provided in writing at the conclusion of the
visit. If applicable, a reminder letter will be sent to the patient
regarding the next appointment.

BI-RADS CATEGORY  1: Negative.

## 2017-01-07 ENCOUNTER — Other Ambulatory Visit: Payer: Self-pay

## 2017-01-07 ENCOUNTER — Emergency Department (HOSPITAL_COMMUNITY)
Admission: EM | Admit: 2017-01-07 | Discharge: 2017-01-07 | Disposition: A | Discharge status: Home / Self Care | Payer: Managed Care, Other (non HMO) | Attending: Emergency Medicine | Admitting: Emergency Medicine

## 2017-01-07 ENCOUNTER — Encounter (HOSPITAL_COMMUNITY): Payer: Self-pay | Admitting: *Deleted

## 2017-01-07 ENCOUNTER — Emergency Department (HOSPITAL_COMMUNITY): Payer: Managed Care, Other (non HMO)

## 2017-01-07 DIAGNOSIS — R0789 Other chest pain: Secondary | ICD-10-CM

## 2017-01-07 DIAGNOSIS — Z79899 Other long term (current) drug therapy: Secondary | ICD-10-CM | POA: Insufficient documentation

## 2017-01-07 DIAGNOSIS — Z7982 Long term (current) use of aspirin: Secondary | ICD-10-CM | POA: Diagnosis not present

## 2017-01-07 DIAGNOSIS — J4520 Mild intermittent asthma, uncomplicated: Secondary | ICD-10-CM | POA: Diagnosis not present

## 2017-01-07 LAB — CBC
HEMATOCRIT: 42.8 % (ref 36.0–46.0)
Hemoglobin: 14.8 g/dL (ref 12.0–15.0)
MCH: 32.7 pg (ref 26.0–34.0)
MCHC: 34.6 g/dL (ref 30.0–36.0)
MCV: 94.5 fL (ref 78.0–100.0)
Platelets: 250 10*3/uL (ref 150–400)
RBC: 4.53 MIL/uL (ref 3.87–5.11)
RDW: 13 % (ref 11.5–15.5)
WBC: 6.2 10*3/uL (ref 4.0–10.5)

## 2017-01-07 LAB — HEPATIC FUNCTION PANEL
ALT: 18 U/L (ref 14–54)
AST: 18 U/L (ref 15–41)
Albumin: 3.5 g/dL (ref 3.5–5.0)
Alkaline Phosphatase: 78 U/L (ref 38–126)
BILIRUBIN DIRECT: 0.2 mg/dL (ref 0.1–0.5)
BILIRUBIN INDIRECT: 0.4 mg/dL (ref 0.3–0.9)
BILIRUBIN TOTAL: 0.6 mg/dL (ref 0.3–1.2)
Total Protein: 5.7 g/dL — ABNORMAL LOW (ref 6.5–8.1)

## 2017-01-07 LAB — I-STAT TROPONIN, ED
TROPONIN I, POC: 0.01 ng/mL (ref 0.00–0.08)
Troponin i, poc: 0 ng/mL (ref 0.00–0.08)

## 2017-01-07 LAB — BASIC METABOLIC PANEL
Anion gap: 8 (ref 5–15)
BUN: 13 mg/dL (ref 6–20)
CHLORIDE: 102 mmol/L (ref 101–111)
CO2: 25 mmol/L (ref 22–32)
Calcium: 9.6 mg/dL (ref 8.9–10.3)
Creatinine, Ser: 0.69 mg/dL (ref 0.44–1.00)
GFR calc Af Amer: >60 mL/min (ref >60)
GFR calc non Af Amer: >60 mL/min (ref >60)
GLUCOSE: 146 mg/dL — AB (ref 65–99)
POTASSIUM: 3.5 mmol/L (ref 3.5–5.1)
Sodium: 135 mmol/L (ref 135–145)

## 2017-01-07 LAB — I-STAT CG4 LACTIC ACID, ED: LACTIC ACID, VENOUS: 0.79 mmol/L (ref 0.5–1.9)

## 2017-01-07 LAB — D-DIMER, QUANTITATIVE: D-Dimer, Quant: <0.27 ug/mL-FEU (ref 0.00–0.50)

## 2017-01-07 LAB — LIPASE, BLOOD: Lipase: 31 U/L (ref 11–51)

## 2017-01-07 MED ORDER — FAMOTIDINE 20 MG PO TABS: 20.0000 mg | ORAL_TABLET | Freq: Two times a day (BID) | ORAL | 0 refills | Status: AC

## 2017-01-07 NOTE — ED Triage Notes (Addendum)
PT had a follow up appointment with Dr Einar Gip scheduled for 4 pm today to talk about results from a cardiac stress test with dye she had performed yesterday.  Now c/o L sided chest pressure.  Also c/o indigestion.  Airplane flight 2 weeks prior.

## 2017-01-07 NOTE — Discharge Instructions (Addendum)
Your troponin enzymes were normal x2, your d-dimer was not elevated, your liver function enzymes and your lipase were normal. Your chest x-ray was unremarkable. Take Pepcid regularly for 1 week. Follow-up with your cardiologist for further evaluation. Return to ED for worsening chest pain, shortness of breath, coughing up blood, leg swelling or loss of consciousness.

## 2017-01-07 NOTE — ED Notes (Signed)
ED Provider at bedside. 

## 2017-01-07 NOTE — ED Provider Notes (Signed)
[facility] EMERGENCY DEPARTMENT Provider Note   CSN: [id number] Arrival date & time: 01/07/17  1313     History   Chief Complaint Chief Complaint  Patient presents with  . Chest Pain    HPI Misty Scott is a 66 y.o. female with past medical history of asthma, who presents to ED for evaluation 7-hour history of-central chest pain.  She reports that it feels like "pressure" and unlike any pain that she has had before.  She did complete a cardiac stress test 2 days ago and was on her way to a follow-up appointment with her cardiologist when she began having the pain.  She also reports intermittent shortness of breath but is unsure if this is due to her anxiety from the pain.  States that the pain has subsided since being here in the emergency room.  She reports recent prolonged travel earlier in the month to [place] and then recently to [place].  She denies any leg swelling, hemoptysis, fevers, wheezing, prior DVT, PE, MI, productive cough, URI symptoms.  HPI  Past Medical History:  Diagnosis Date  . Allergic rhinitis   . Asthma   . Food allergy    Shellfish  . LPRD (laryngopharyngeal reflux disease)   . Ocular rosacea   . Snoring     Patient Active Problem List   Diagnosis Date Noted  . Mild intermittent asthma 02/14/2015  . LPRD (laryngopharyngeal reflux disease) 02/14/2015  . Other allergic rhinitis 02/14/2015  . History of food allergy 02/14/2015  . Snoring 02/14/2015    Past Surgical History:  Procedure Laterality Date  . ABDOMINAL HYSTERECTOMY  2012  . ADENOIDECTOMY    . APPENDECTOMY  1974  . CESAREAN SECTION  1987  . KNEE SURGERY Left 2003  . SINOSCOPY  2002  . TONSILLECTOMY      OB History    No data available       Home Medications    Prior to Admission medications   Medication Sig Start Date End Date Taking? Authorizing Provider  albuterol (PROVENTIL HFA) 108 (90 Base) MCG/ACT inhaler Inhale 2 puffs into the lungs every 4  (four) hours as needed for wheezing or shortness of breath.   Yes [provider]  beclomethasone (QVAR REDIHALER) 40 MCG/ACT inhaler Inhale 2 puffs into the lungs 2 (two) times daily.   Yes [provider]  Aloe-Sodium Chloride (AYR SALINE NASAL GEL NA) Place into the nose as needed.    [provider]  aspirin 81 MG tablet Take 81 mg by mouth daily.    [provider]  Cholecalciferol (VITAMIN D-3) 1000 units CAPS Take 1,000 Units by mouth daily.     [provider]  Diclofenac Sodium (PENNSAID) 2 % SOLN PLACE 2 PUMPS ON SKIN 2 TIMES DAILY 08/21/15   [provider]  EPINEPHrine (EPIPEN 2-PAK) 0.3 mg/0.3 mL IJ SOAJ injection Use as directed for life-threatening allergic reaction. 09/25/15   Kozlow, Donnamarie Poag, MD  famotidine (PEPCID) 20 MG tablet Take 1 tablet (20 mg total) by mouth 2 (two) times daily. 01/07/17   Jacky Hartung, PA-C  fluticasone (FLONASE) 50 MCG/ACT nasal spray Place 2 sprays into both nostrils daily.    [provider]  fluticasone (FLOVENT HFA) 110 MCG/ACT inhaler Use 3 puffs 3 times daily as directed for asthma flareup symptoms 08/14/16   Kozlow, Donnamarie Poag, MD  ibuprofen (ADVIL,MOTRIN) 800 MG tablet Take by mouth as needed.    [provider]  lubiprostone Baker Pierini)  8 MCG capsule Take 8 mcg by mouth 2 (two) times daily with a meal.    [provider]  montelukast (SINGULAIR) 10 MG tablet Take 10 mg by mouth at bedtime.    [provider]  Olopatadine HCl 0.2 % SOLN Apply to eye.    [provider]  Omega-3 Fatty Acids (FISH OIL) 500 MG CAPS Take by mouth.    [provider]  omeprazole (PRILOSEC) 40 MG capsule Take 1 capsule (40 mg total) by mouth daily. 04/23/15   Kozlow, Donnamarie Poag, MD  PENNSAID 2 % SOLN  08/21/15   [provider]  Propylene Glycol (SYSTANE BALANCE OP) Apply 2 drops to eye 2 (two) times daily.    [provider]  ranitidine (ZANTAC) 300 MG capsule  Take 1 capsule (300 mg total) by mouth every evening. 07/02/15   Kozlow, Donnamarie Poag, MD  Simethicone (GAS-X PO) Take by mouth as needed.    [provider]    Family History No family history on file.  Social History Social History   Tobacco Use  . Smoking status: Never Smoker  . Smokeless tobacco: Never Used  Substance Use Topics  . Alcohol use: Yes    Alcohol/week: 0.0 oz  . Drug use: No     Allergies   Avelox [moxifloxacin hcl in nacl]; Nsaids; Shellfish allergy; and Tolmetin   Review of Systems Review of Systems  Constitutional: Negative for appetite change, chills and fever.  HENT: Negative for ear pain, rhinorrhea, sneezing and sore throat.   Eyes: Negative for photophobia and visual disturbance.  Respiratory: Positive for shortness of breath. Negative for cough, chest tightness and wheezing.   Cardiovascular: Positive for chest pain. Negative for palpitations.  Gastrointestinal: Negative for abdominal pain, blood in stool, constipation, diarrhea, nausea and vomiting.  Genitourinary: Negative for dysuria, hematuria and urgency.  Musculoskeletal: Negative for myalgias.  Skin: Negative for rash.  Neurological: Negative for dizziness, weakness and light-headedness.     Physical Exam Updated Vital Signs BP 121/76   Pulse 63   Temp 98.5 F (36.9 C) (Oral)   Resp 11   Ht 5\' 1"  (1.549 m)   Wt 65.3 kg (144 lb)   SpO2 97%   BMI 27.21 kg/m   Physical Exam  Constitutional: She appears well-developed and well-nourished. No distress.  HENT:  Head: Normocephalic and atraumatic.  Nose: Nose normal.  Eyes: Conjunctivae and EOM are normal. Right eye exhibits no discharge. Left eye exhibits no discharge. No scleral icterus.  Neck: Normal range of motion. Neck supple.  Cardiovascular: Normal rate, regular rhythm, normal heart sounds and intact distal pulses. Exam reveals no gallop and no friction rub.  No murmur heard. Pulmonary/Chest: Effort normal and breath  sounds normal. No respiratory distress.  Abdominal: Soft. Bowel sounds are normal. She exhibits no distension. There is no tenderness. There is no guarding.  Musculoskeletal: Normal range of motion. She exhibits no edema.  No bilateral lower extremity edema noted.  No calf tenderness bilaterally.  Neurological: She is alert. She exhibits normal muscle tone. Coordination normal.  Skin: Skin is warm and dry. No rash noted.  Psychiatric: She has a normal mood and affect.  Nursing note and vitals reviewed.    ED Treatments / Results  Labs (all labs ordered are listed, but only abnormal results are displayed) Labs Reviewed  BASIC METABOLIC PANEL - Abnormal; Notable for the following components:      Result Value   Glucose, Bld 146 (*)  All other components within normal limits  HEPATIC FUNCTION PANEL - Abnormal; Notable for the following components:   Total Protein 5.7 (*)    All other components within normal limits  CBC  D-DIMER, QUANTITATIVE (NOT AT [facility])  LIPASE, BLOOD  I-STAT TROPONIN, ED  I-STAT TROPONIN, ED  I-STAT CG4 LACTIC ACID, ED    EKG  EKG Interpretation None       Radiology Dg Chest 2 View  Result Date: 01/07/2017 CLINICAL DATA:  65 y/o  F; chest pressure and tightness. EXAM: CHEST  2 VIEW COMPARISON:  None. FINDINGS: The heart size and mediastinal contours are within normal limits. Both lungs are clear. The visualized skeletal structures are unremarkable. IMPRESSION: No active cardiopulmonary disease. Electronically Signed   By: Kristine Garbe M.D.   On: 01/07/2017 14:28    Procedures Procedures (including critical care time)  Medications Ordered in ED Medications - No data to display   Initial Impression / Assessment and Plan / ED Course  I have reviewed the triage vital signs and the nursing notes.  Pertinent labs & imaging results that were available during my care of the patient were reviewed by me and considered in my medical decision  making (see chart for details).  Clinical Course as of Jan 07 2209  Wed Jan 07, 2017  1843 Patient not roomed when I arrived for initial evaluation.  [HK]    Clinical Course User Index [HK] Delia Heady, PA-C    Patient presents to ED for evaluation of central/left-sided chest pain for the past 7 hours prior to arrival.  She does have a history of asthma and GERD.  She completed a cardiac stress test 2 days ago and was on her way for follow-up appointment regarding results.  She does state that the pain has subsided greatly since being here in the ED.  On physical exam patient is overall well-appearing.  Oxygen saturations 97-99% on room air.  There is no leg swelling noted.  Her lungs are clear to auscultation bilaterally.  She denies any previous cardiac or pulmonary history.  EKG showed normal sinus rhythm.  Initial lab work including troponin, CBC, BMP were unremarkable.Troponin returned as negative.  D-dimer normal.  LFTs and lipase also returned as unremarkable.  EKG is unremarkable.  Stress test results showed that patient had normal exercise capacity for age there were no EKG changes suggestive of ischemia on her stress EKG.  She does have a follow-up appointment with her cardiologist tomorrow.  I suspect that her symptoms could be due to reflux.  She has not been on any reflux controlling medications for the past 2 years.  She states that she does not take PPIs due to side effects.  We will give her H2 receptor blockers and advised her to continue for 1 week regularly.  Advised her to follow-up with her cardiologist for further evaluation.  Patient appears stable for discharge at this time.  Strict return precautions given.  Final Clinical Impressions(s) / ED Diagnoses   Final diagnoses:  Chest wall pain    ED Discharge Orders        Ordered    famotidine (PEPCID) 20 MG tablet  2 times daily     01/07/17 2202       Delia Heady, PA-C 01/07/17 2213    Charlesetta Shanks,  MD 01/11/17 1840

## 2017-05-13 DIAGNOSIS — Z8679 Personal history of other diseases of the circulatory system: Secondary | ICD-10-CM | POA: Diagnosis not present

## 2017-05-13 DIAGNOSIS — I493 Ventricular premature depolarization: Secondary | ICD-10-CM | POA: Diagnosis not present

## 2017-05-14 DIAGNOSIS — L218 Other seborrheic dermatitis: Secondary | ICD-10-CM | POA: Diagnosis not present

## 2017-06-02 DIAGNOSIS — M79662 Pain in left lower leg: Secondary | ICD-10-CM | POA: Diagnosis not present

## 2017-06-02 DIAGNOSIS — B029 Zoster without complications: Secondary | ICD-10-CM | POA: Diagnosis not present

## 2017-06-02 DIAGNOSIS — K581 Irritable bowel syndrome with constipation: Secondary | ICD-10-CM | POA: Diagnosis not present

## 2017-06-11 DIAGNOSIS — Z1231 Encounter for screening mammogram for malignant neoplasm of breast: Secondary | ICD-10-CM | POA: Diagnosis not present

## 2017-06-11 DIAGNOSIS — M8588 Other specified disorders of bone density and structure, other site: Secondary | ICD-10-CM | POA: Diagnosis not present

## 2017-06-11 DIAGNOSIS — Z01419 Encounter for gynecological examination (general) (routine) without abnormal findings: Secondary | ICD-10-CM | POA: Diagnosis not present

## 2017-06-11 DIAGNOSIS — Z681 Body mass index (BMI) 19 or less, adult: Secondary | ICD-10-CM | POA: Diagnosis not present

## 2017-06-11 DIAGNOSIS — N958 Other specified menopausal and perimenopausal disorders: Secondary | ICD-10-CM | POA: Diagnosis not present

## 2017-06-16 DIAGNOSIS — E789 Disorder of lipoprotein metabolism, unspecified: Secondary | ICD-10-CM | POA: Diagnosis not present

## 2017-06-16 DIAGNOSIS — N390 Urinary tract infection, site not specified: Secondary | ICD-10-CM | POA: Diagnosis not present

## 2017-06-16 DIAGNOSIS — Z13220 Encounter for screening for lipoid disorders: Secondary | ICD-10-CM | POA: Diagnosis not present

## 2017-06-16 DIAGNOSIS — Z0000 Encounter for general adult medical examination without abnormal findings: Secondary | ICD-10-CM | POA: Diagnosis not present

## 2017-06-16 DIAGNOSIS — E559 Vitamin D deficiency, unspecified: Secondary | ICD-10-CM | POA: Diagnosis not present

## 2017-06-23 DIAGNOSIS — J4521 Mild intermittent asthma with (acute) exacerbation: Secondary | ICD-10-CM | POA: Diagnosis not present

## 2017-06-23 DIAGNOSIS — M810 Age-related osteoporosis without current pathological fracture: Secondary | ICD-10-CM | POA: Diagnosis not present

## 2017-06-23 DIAGNOSIS — K581 Irritable bowel syndrome with constipation: Secondary | ICD-10-CM | POA: Diagnosis not present

## 2017-06-23 DIAGNOSIS — K219 Gastro-esophageal reflux disease without esophagitis: Secondary | ICD-10-CM | POA: Diagnosis not present

## 2017-06-23 DIAGNOSIS — M1711 Unilateral primary osteoarthritis, right knee: Secondary | ICD-10-CM | POA: Diagnosis not present

## 2017-06-23 DIAGNOSIS — M25572 Pain in left ankle and joints of left foot: Secondary | ICD-10-CM | POA: Diagnosis not present

## 2017-06-23 DIAGNOSIS — Z6827 Body mass index (BMI) 27.0-27.9, adult: Secondary | ICD-10-CM | POA: Diagnosis not present

## 2017-06-23 DIAGNOSIS — R0982 Postnasal drip: Secondary | ICD-10-CM | POA: Diagnosis not present

## 2017-06-23 DIAGNOSIS — R252 Cramp and spasm: Secondary | ICD-10-CM | POA: Diagnosis not present

## 2017-06-23 DIAGNOSIS — H53469 Homonymous bilateral field defects, unspecified side: Secondary | ICD-10-CM | POA: Diagnosis not present

## 2017-06-23 DIAGNOSIS — Z0000 Encounter for general adult medical examination without abnormal findings: Secondary | ICD-10-CM | POA: Diagnosis not present

## 2017-06-23 DIAGNOSIS — Z23 Encounter for immunization: Secondary | ICD-10-CM | POA: Diagnosis not present

## 2017-06-24 DIAGNOSIS — M25562 Pain in left knee: Secondary | ICD-10-CM | POA: Diagnosis not present

## 2017-06-24 DIAGNOSIS — M94262 Chondromalacia, left knee: Secondary | ICD-10-CM | POA: Diagnosis not present

## 2017-07-01 DIAGNOSIS — M25572 Pain in left ankle and joints of left foot: Secondary | ICD-10-CM | POA: Diagnosis not present

## 2017-07-01 DIAGNOSIS — M76822 Posterior tibial tendinitis, left leg: Secondary | ICD-10-CM | POA: Diagnosis not present

## 2017-07-17 DIAGNOSIS — M76822 Posterior tibial tendinitis, left leg: Secondary | ICD-10-CM | POA: Diagnosis not present

## 2017-07-31 DIAGNOSIS — M76822 Posterior tibial tendinitis, left leg: Secondary | ICD-10-CM | POA: Diagnosis not present

## 2017-08-05 DIAGNOSIS — M76822 Posterior tibial tendinitis, left leg: Secondary | ICD-10-CM | POA: Diagnosis not present

## 2017-08-07 DIAGNOSIS — I493 Ventricular premature depolarization: Secondary | ICD-10-CM | POA: Diagnosis not present

## 2017-08-07 DIAGNOSIS — Z8679 Personal history of other diseases of the circulatory system: Secondary | ICD-10-CM | POA: Diagnosis not present

## 2017-08-11 DIAGNOSIS — J4521 Mild intermittent asthma with (acute) exacerbation: Secondary | ICD-10-CM | POA: Diagnosis not present

## 2017-08-11 DIAGNOSIS — B029 Zoster without complications: Secondary | ICD-10-CM | POA: Diagnosis not present

## 2017-08-17 DIAGNOSIS — M76829 Posterior tibial tendinitis, unspecified leg: Secondary | ICD-10-CM | POA: Diagnosis not present

## 2017-08-18 DIAGNOSIS — H52203 Unspecified astigmatism, bilateral: Secondary | ICD-10-CM | POA: Diagnosis not present

## 2017-08-18 DIAGNOSIS — Z961 Presence of intraocular lens: Secondary | ICD-10-CM | POA: Diagnosis not present

## 2017-08-18 DIAGNOSIS — H26493 Other secondary cataract, bilateral: Secondary | ICD-10-CM | POA: Diagnosis not present

## 2017-08-20 DIAGNOSIS — L309 Dermatitis, unspecified: Secondary | ICD-10-CM | POA: Diagnosis not present

## 2017-08-21 DIAGNOSIS — B0089 Other herpesviral infection: Secondary | ICD-10-CM | POA: Diagnosis not present

## 2017-08-24 DIAGNOSIS — M76822 Posterior tibial tendinitis, left leg: Secondary | ICD-10-CM | POA: Diagnosis not present

## 2017-09-25 DIAGNOSIS — M859 Disorder of bone density and structure, unspecified: Secondary | ICD-10-CM | POA: Diagnosis not present

## 2017-10-01 DIAGNOSIS — M94262 Chondromalacia, left knee: Secondary | ICD-10-CM | POA: Diagnosis not present

## 2017-10-01 DIAGNOSIS — M25562 Pain in left knee: Secondary | ICD-10-CM | POA: Diagnosis not present

## 2017-11-20 DIAGNOSIS — Z23 Encounter for immunization: Secondary | ICD-10-CM | POA: Diagnosis not present

## 2017-12-24 DIAGNOSIS — D1801 Hemangioma of skin and subcutaneous tissue: Secondary | ICD-10-CM | POA: Diagnosis not present

## 2017-12-24 DIAGNOSIS — D2271 Melanocytic nevi of right lower limb, including hip: Secondary | ICD-10-CM | POA: Diagnosis not present

## 2017-12-24 DIAGNOSIS — L720 Epidermal cyst: Secondary | ICD-10-CM | POA: Diagnosis not present

## 2017-12-24 DIAGNOSIS — D2272 Melanocytic nevi of left lower limb, including hip: Secondary | ICD-10-CM | POA: Diagnosis not present

## 2017-12-24 DIAGNOSIS — L821 Other seborrheic keratosis: Secondary | ICD-10-CM | POA: Diagnosis not present

## 2017-12-24 DIAGNOSIS — D225 Melanocytic nevi of trunk: Secondary | ICD-10-CM | POA: Diagnosis not present

## 2017-12-24 DIAGNOSIS — D224 Melanocytic nevi of scalp and neck: Secondary | ICD-10-CM | POA: Diagnosis not present

## 2017-12-24 DIAGNOSIS — D2261 Melanocytic nevi of right upper limb, including shoulder: Secondary | ICD-10-CM | POA: Diagnosis not present

## 2017-12-24 DIAGNOSIS — L814 Other melanin hyperpigmentation: Secondary | ICD-10-CM | POA: Diagnosis not present

## 2018-01-04 DIAGNOSIS — M94262 Chondromalacia, left knee: Secondary | ICD-10-CM | POA: Diagnosis not present

## 2018-01-04 DIAGNOSIS — M25562 Pain in left knee: Secondary | ICD-10-CM | POA: Diagnosis not present

## 2018-05-27 DIAGNOSIS — R197 Diarrhea, unspecified: Secondary | ICD-10-CM | POA: Diagnosis not present

## 2018-05-27 DIAGNOSIS — K581 Irritable bowel syndrome with constipation: Secondary | ICD-10-CM | POA: Diagnosis not present

## 2018-06-01 DIAGNOSIS — R6889 Other general symptoms and signs: Secondary | ICD-10-CM | POA: Diagnosis not present

## 2018-06-01 DIAGNOSIS — I7300 Raynaud's syndrome without gangrene: Secondary | ICD-10-CM | POA: Diagnosis not present

## 2018-06-01 DIAGNOSIS — R197 Diarrhea, unspecified: Secondary | ICD-10-CM | POA: Diagnosis not present

## 2018-06-24 DIAGNOSIS — Z1159 Encounter for screening for other viral diseases: Secondary | ICD-10-CM | POA: Diagnosis not present

## 2018-06-24 DIAGNOSIS — R002 Palpitations: Secondary | ICD-10-CM | POA: Diagnosis not present

## 2018-06-24 DIAGNOSIS — Z0000 Encounter for general adult medical examination without abnormal findings: Secondary | ICD-10-CM | POA: Diagnosis not present

## 2018-06-24 DIAGNOSIS — Z7982 Long term (current) use of aspirin: Secondary | ICD-10-CM | POA: Diagnosis not present

## 2018-06-24 DIAGNOSIS — E789 Disorder of lipoprotein metabolism, unspecified: Secondary | ICD-10-CM | POA: Diagnosis not present

## 2018-06-29 DIAGNOSIS — Z0000 Encounter for general adult medical examination without abnormal findings: Secondary | ICD-10-CM | POA: Diagnosis not present

## 2018-06-29 DIAGNOSIS — Z1211 Encounter for screening for malignant neoplasm of colon: Secondary | ICD-10-CM | POA: Diagnosis not present

## 2018-06-29 DIAGNOSIS — R197 Diarrhea, unspecified: Secondary | ICD-10-CM | POA: Diagnosis not present

## 2018-06-29 DIAGNOSIS — R002 Palpitations: Secondary | ICD-10-CM | POA: Diagnosis not present

## 2018-06-29 DIAGNOSIS — I7300 Raynaud's syndrome without gangrene: Secondary | ICD-10-CM | POA: Diagnosis not present

## 2018-06-30 DIAGNOSIS — R197 Diarrhea, unspecified: Secondary | ICD-10-CM | POA: Diagnosis not present

## 2018-07-12 DIAGNOSIS — Z1211 Encounter for screening for malignant neoplasm of colon: Secondary | ICD-10-CM | POA: Diagnosis not present

## 2018-07-12 DIAGNOSIS — Z1212 Encounter for screening for malignant neoplasm of rectum: Secondary | ICD-10-CM | POA: Diagnosis not present

## 2018-07-14 DIAGNOSIS — R002 Palpitations: Secondary | ICD-10-CM | POA: Diagnosis not present

## 2018-07-22 DIAGNOSIS — R002 Palpitations: Secondary | ICD-10-CM | POA: Diagnosis not present

## 2018-07-29 DIAGNOSIS — R194 Change in bowel habit: Secondary | ICD-10-CM | POA: Diagnosis not present

## 2018-07-29 DIAGNOSIS — R1310 Dysphagia, unspecified: Secondary | ICD-10-CM | POA: Diagnosis not present

## 2018-07-29 DIAGNOSIS — R140 Abdominal distension (gaseous): Secondary | ICD-10-CM | POA: Diagnosis not present

## 2018-07-29 DIAGNOSIS — K5730 Diverticulosis of large intestine without perforation or abscess without bleeding: Secondary | ICD-10-CM | POA: Diagnosis not present

## 2018-07-29 DIAGNOSIS — Z1211 Encounter for screening for malignant neoplasm of colon: Secondary | ICD-10-CM | POA: Diagnosis not present

## 2018-08-27 DIAGNOSIS — Z1211 Encounter for screening for malignant neoplasm of colon: Secondary | ICD-10-CM | POA: Diagnosis not present

## 2018-08-27 DIAGNOSIS — R194 Change in bowel habit: Secondary | ICD-10-CM | POA: Diagnosis not present

## 2018-08-27 DIAGNOSIS — D122 Benign neoplasm of ascending colon: Secondary | ICD-10-CM | POA: Diagnosis not present

## 2018-08-27 DIAGNOSIS — R1310 Dysphagia, unspecified: Secondary | ICD-10-CM | POA: Diagnosis not present

## 2018-08-27 DIAGNOSIS — K635 Polyp of colon: Secondary | ICD-10-CM | POA: Diagnosis not present

## 2018-10-15 DIAGNOSIS — Z23 Encounter for immunization: Secondary | ICD-10-CM | POA: Diagnosis not present

## 2018-12-13 ENCOUNTER — Other Ambulatory Visit: Payer: Self-pay | Admitting: Cardiology

## 2019-03-09 ENCOUNTER — Ambulatory Visit: Payer: Managed Care, Other (non HMO)

## 2019-03-10 ENCOUNTER — Ambulatory Visit: Payer: Managed Care, Other (non HMO)

## 2019-03-14 ENCOUNTER — Ambulatory Visit: Payer: Managed Care, Other (non HMO)

## 2019-03-18 ENCOUNTER — Ambulatory Visit: Payer: Medicare Other | Attending: Internal Medicine

## 2019-03-18 DIAGNOSIS — Z23 Encounter for immunization: Secondary | ICD-10-CM

## 2019-03-18 NOTE — Progress Notes (Signed)
   Covid-19 Vaccination Clinic  Name:  Misty Scott    MRN: [id number] DOB: Nov 01, 1950  03/18/2019  Misty Scott was observed post Covid-19 immunization for 15 minutes without incidence. She was provided with Vaccine Information Sheet and instruction to access the V-Safe system.   Misty Scott was instructed to call 911 with any severe reactions post vaccine: Marland Kitchen Difficulty breathing  . Swelling of your face and throat  . A fast heartbeat  . A bad rash all over your body  . Dizziness and weakness    Immunizations Administered    Name Date Dose VIS Date Route   Pfizer COVID-19 Vaccine 03/18/2019  8:45 AM 0.3 mL 01/21/2019 Intramuscular   Manufacturer: [place]   Lot: [id number]   [place]: [id number]

## 2019-03-27 IMAGING — DX DG CHEST 2V
2 series · 2 of 2 positions shown · non-contrast
Comparison: None.

CLINICAL DATA: 66 y/o  F; chest pressure and tightness.

EXAM:
CHEST  2 VIEW

[chest pa]
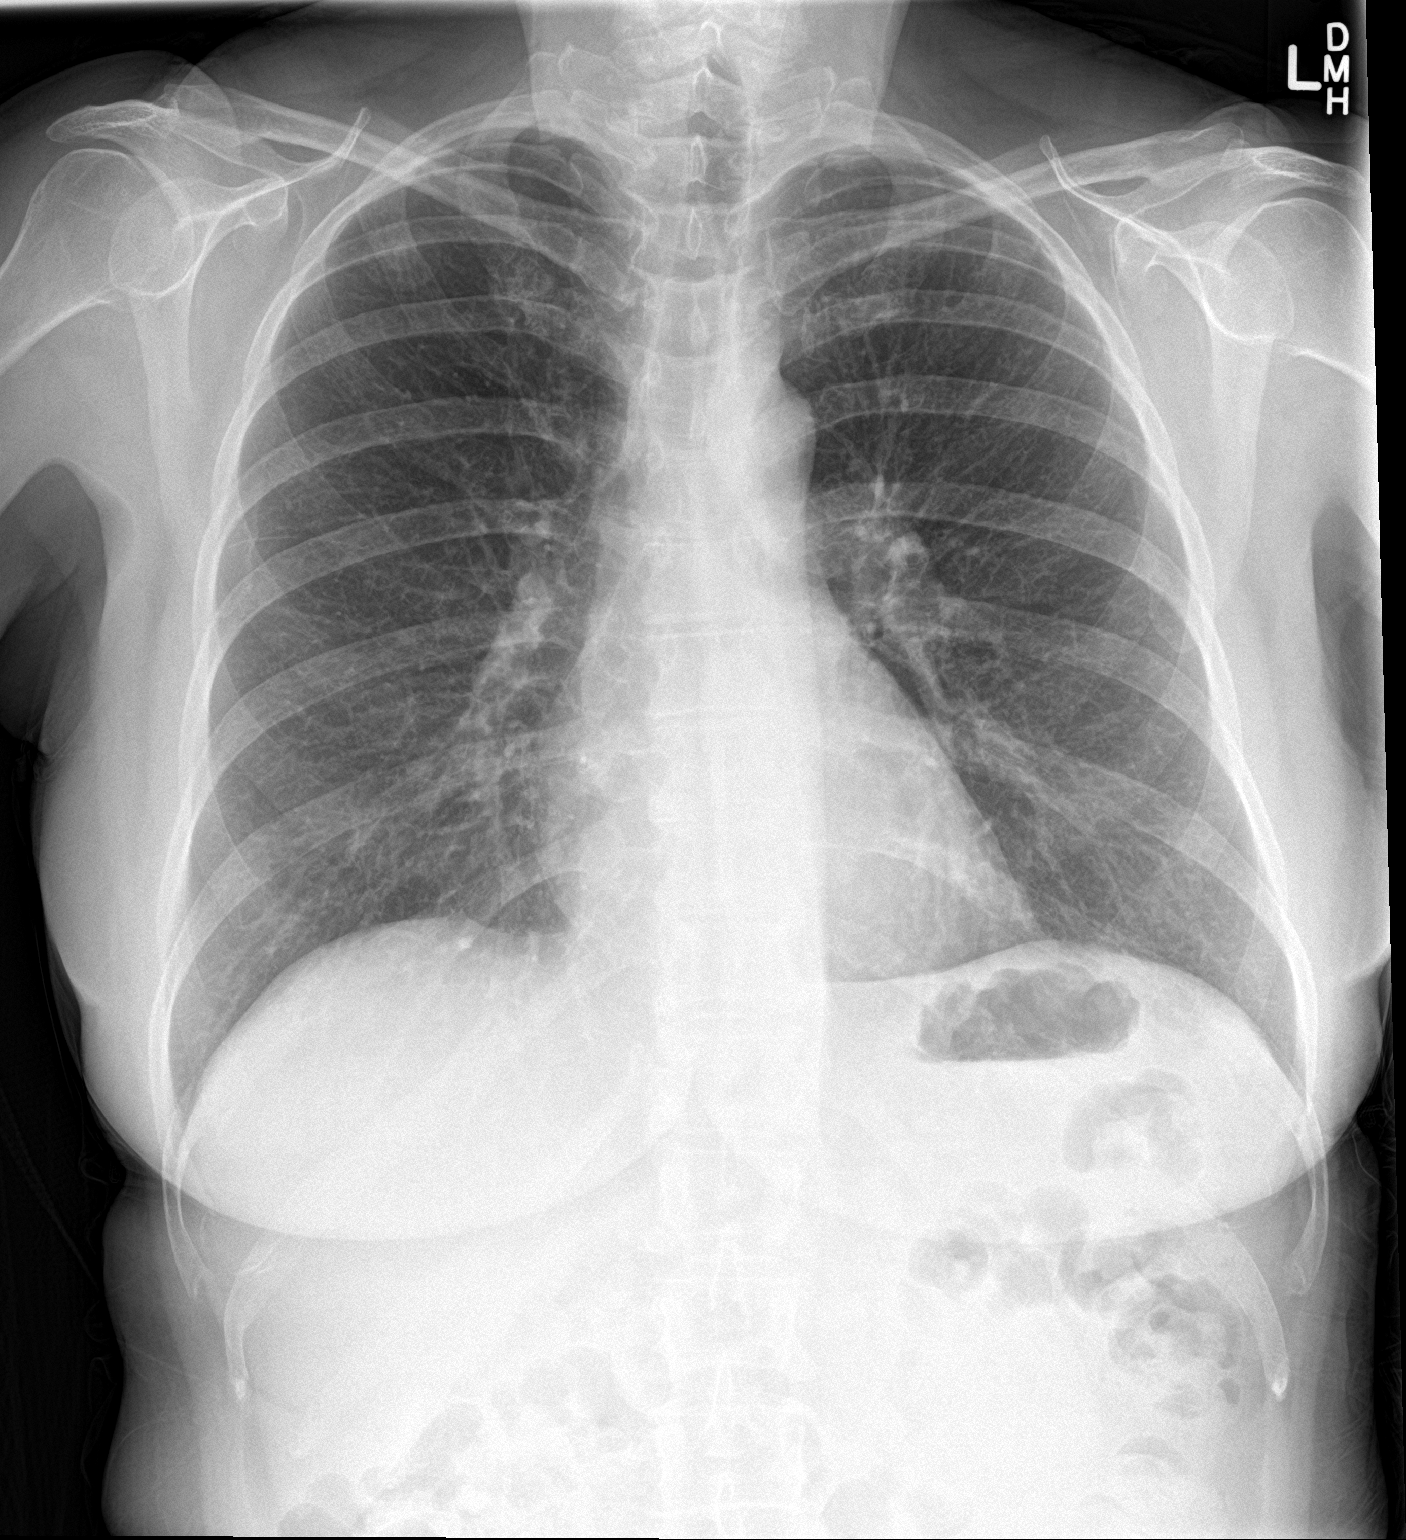

[chest lat]
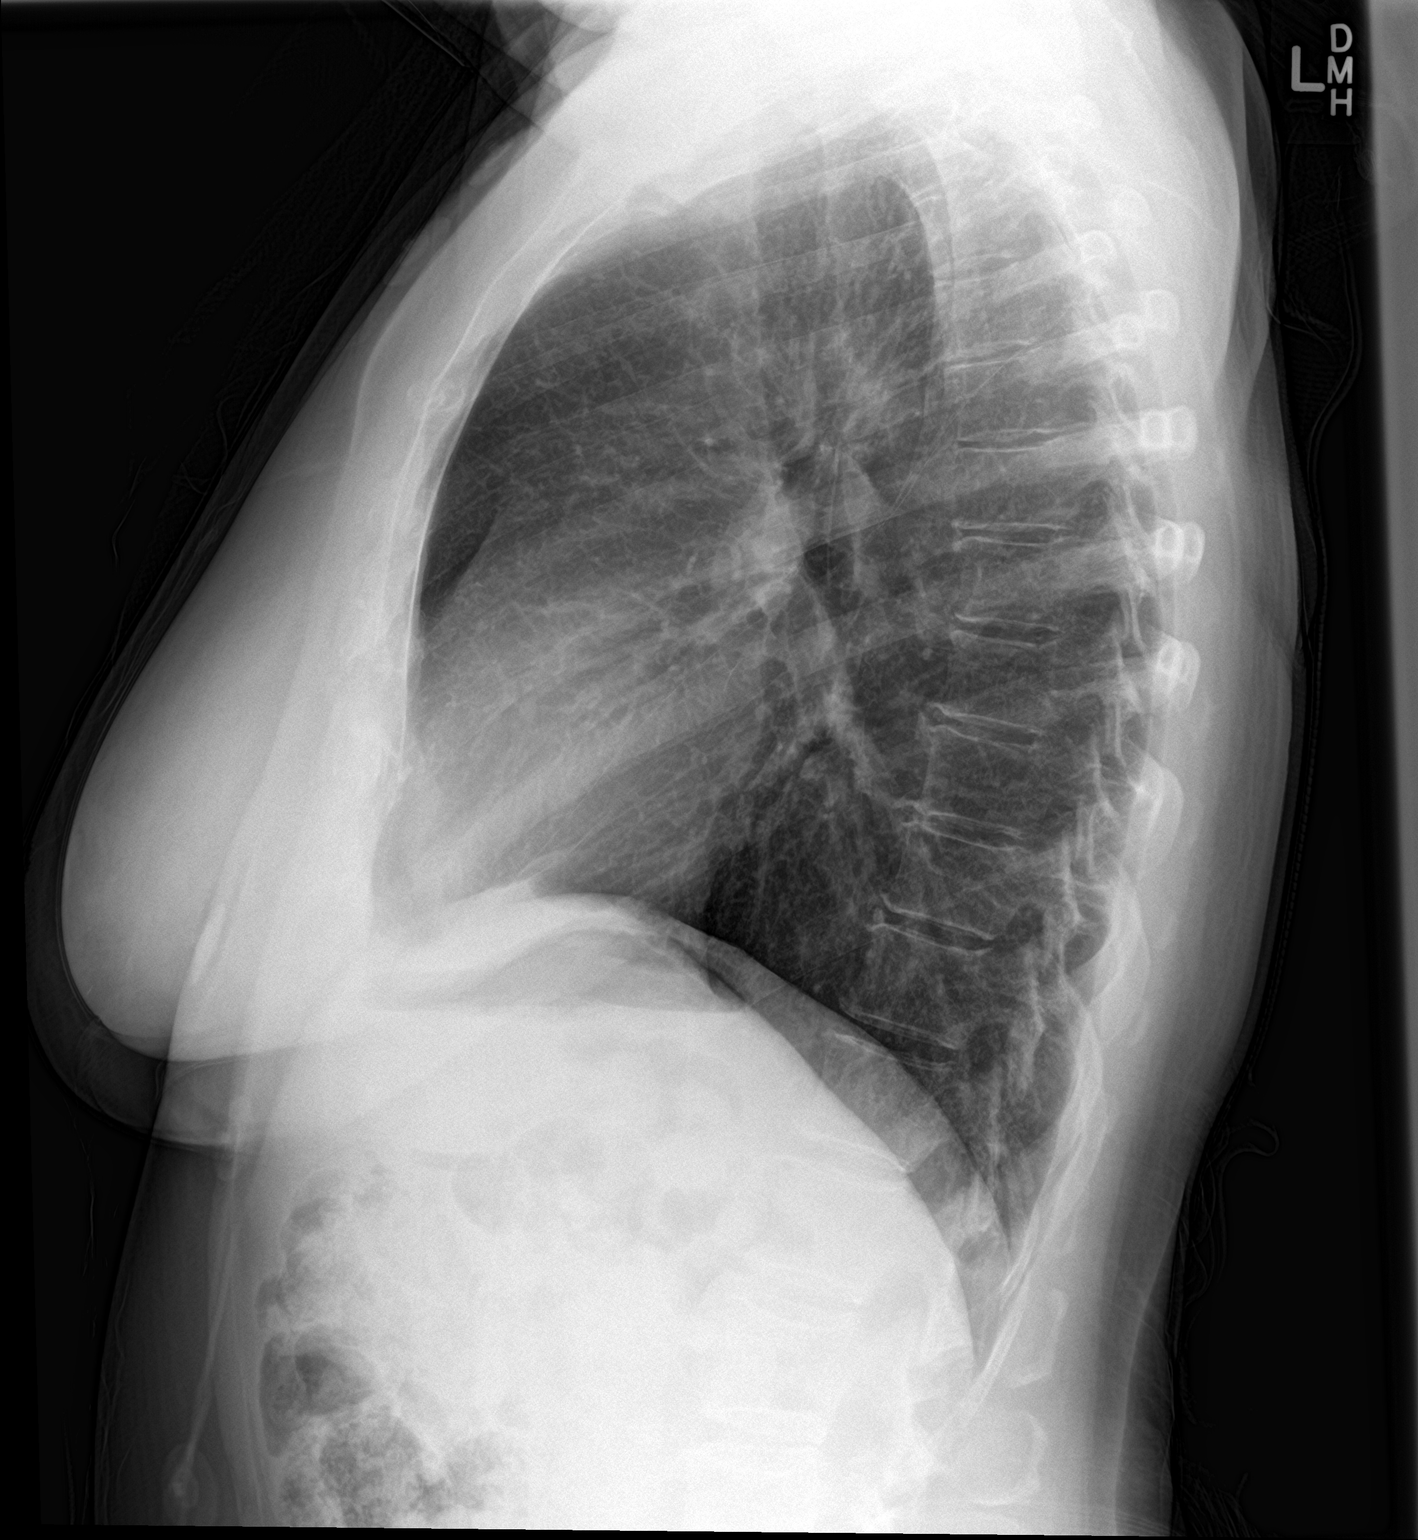

[2 of 2 positions shown; findings below may reference images not displayed]

FINDINGS: The heart size and mediastinal contours are within normal limits.
Both lungs are clear. The visualized skeletal structures are
unremarkable.
IMPRESSION: No active cardiopulmonary disease.

By: Mei Jorgenson M.D.

## 2019-03-30 ENCOUNTER — Ambulatory Visit: Payer: Managed Care, Other (non HMO)

## 2019-04-12 ENCOUNTER — Ambulatory Visit: Payer: Medicare Other | Attending: Internal Medicine

## 2019-04-12 DIAGNOSIS — Z23 Encounter for immunization: Secondary | ICD-10-CM | POA: Insufficient documentation

## 2019-04-12 NOTE — Progress Notes (Signed)
   Covid-19 Vaccination Clinic  Name:  Misty Scott    MRN: [id number] DOB: 1950/06/13  04/12/2019  [name] was observed post Covid-19 immunization for 15 minutes without incident. She was provided with Vaccine Information Sheet and instruction to access the V-Safe system.   [name] was instructed to call 911 with any severe reactions post vaccine: Marland Kitchen Difficulty breathing  . Swelling of face and throat  . A fast heartbeat  . A bad rash all over body  . Dizziness and weakness   Immunizations Administered    Name Date Dose VIS Date Route   Pfizer COVID-19 Vaccine 04/12/2019  9:20 AM 0.3 mL 01/21/2019 Intramuscular   Manufacturer: [place]   Lot: [id number]   [place]: [id number]

## 2019-04-27 DIAGNOSIS — M94262 Chondromalacia, left knee: Secondary | ICD-10-CM | POA: Diagnosis not present

## 2019-04-27 DIAGNOSIS — M25562 Pain in left knee: Secondary | ICD-10-CM | POA: Diagnosis not present

## 2019-06-14 DIAGNOSIS — Z6828 Body mass index (BMI) 28.0-28.9, adult: Secondary | ICD-10-CM | POA: Diagnosis not present

## 2019-06-14 DIAGNOSIS — Z1231 Encounter for screening mammogram for malignant neoplasm of breast: Secondary | ICD-10-CM | POA: Diagnosis not present

## 2019-06-14 DIAGNOSIS — Z124 Encounter for screening for malignant neoplasm of cervix: Secondary | ICD-10-CM | POA: Diagnosis not present

## 2019-06-14 DIAGNOSIS — N958 Other specified menopausal and perimenopausal disorders: Secondary | ICD-10-CM | POA: Diagnosis not present

## 2019-06-14 DIAGNOSIS — M8588 Other specified disorders of bone density and structure, other site: Secondary | ICD-10-CM | POA: Diagnosis not present

## 2019-06-14 DIAGNOSIS — Z1272 Encounter for screening for malignant neoplasm of vagina: Secondary | ICD-10-CM | POA: Diagnosis not present

## 2019-06-16 DIAGNOSIS — M94262 Chondromalacia, left knee: Secondary | ICD-10-CM | POA: Diagnosis not present

## 2019-06-16 DIAGNOSIS — M25562 Pain in left knee: Secondary | ICD-10-CM | POA: Diagnosis not present

## 2019-06-30 DIAGNOSIS — E789 Disorder of lipoprotein metabolism, unspecified: Secondary | ICD-10-CM | POA: Diagnosis not present

## 2019-06-30 DIAGNOSIS — I7300 Raynaud's syndrome without gangrene: Secondary | ICD-10-CM | POA: Diagnosis not present

## 2019-06-30 DIAGNOSIS — R002 Palpitations: Secondary | ICD-10-CM | POA: Diagnosis not present

## 2019-06-30 DIAGNOSIS — M8580 Other specified disorders of bone density and structure, unspecified site: Secondary | ICD-10-CM | POA: Diagnosis not present

## 2019-07-01 DIAGNOSIS — H52203 Unspecified astigmatism, bilateral: Secondary | ICD-10-CM | POA: Diagnosis not present

## 2019-07-01 DIAGNOSIS — Z961 Presence of intraocular lens: Secondary | ICD-10-CM | POA: Diagnosis not present

## 2019-07-01 DIAGNOSIS — H26493 Other secondary cataract, bilateral: Secondary | ICD-10-CM | POA: Diagnosis not present

## 2019-07-06 DIAGNOSIS — R0982 Postnasal drip: Secondary | ICD-10-CM | POA: Diagnosis not present

## 2019-07-06 DIAGNOSIS — M1711 Unilateral primary osteoarthritis, right knee: Secondary | ICD-10-CM | POA: Diagnosis not present

## 2019-07-06 DIAGNOSIS — Z0000 Encounter for general adult medical examination without abnormal findings: Secondary | ICD-10-CM | POA: Diagnosis not present

## 2019-07-06 DIAGNOSIS — K581 Irritable bowel syndrome with constipation: Secondary | ICD-10-CM | POA: Diagnosis not present

## 2019-07-06 DIAGNOSIS — B351 Tinea unguium: Secondary | ICD-10-CM | POA: Diagnosis not present

## 2019-07-06 DIAGNOSIS — I493 Ventricular premature depolarization: Secondary | ICD-10-CM | POA: Diagnosis not present

## 2019-07-06 DIAGNOSIS — M810 Age-related osteoporosis without current pathological fracture: Secondary | ICD-10-CM | POA: Diagnosis not present

## 2019-07-06 DIAGNOSIS — K219 Gastro-esophageal reflux disease without esophagitis: Secondary | ICD-10-CM | POA: Diagnosis not present

## 2019-07-06 DIAGNOSIS — H53469 Homonymous bilateral field defects, unspecified side: Secondary | ICD-10-CM | POA: Diagnosis not present

## 2019-07-06 DIAGNOSIS — J4521 Mild intermittent asthma with (acute) exacerbation: Secondary | ICD-10-CM | POA: Diagnosis not present

## 2019-07-26 DIAGNOSIS — H10412 Chronic giant papillary conjunctivitis, left eye: Secondary | ICD-10-CM | POA: Diagnosis not present

## 2019-07-26 DIAGNOSIS — L821 Other seborrheic keratosis: Secondary | ICD-10-CM | POA: Diagnosis not present

## 2019-07-26 DIAGNOSIS — D2261 Melanocytic nevi of right upper limb, including shoulder: Secondary | ICD-10-CM | POA: Diagnosis not present

## 2019-07-26 DIAGNOSIS — D2272 Melanocytic nevi of left lower limb, including hip: Secondary | ICD-10-CM | POA: Diagnosis not present

## 2019-07-26 DIAGNOSIS — D2262 Melanocytic nevi of left upper limb, including shoulder: Secondary | ICD-10-CM | POA: Diagnosis not present

## 2019-07-26 DIAGNOSIS — D1801 Hemangioma of skin and subcutaneous tissue: Secondary | ICD-10-CM | POA: Diagnosis not present

## 2019-07-26 DIAGNOSIS — L218 Other seborrheic dermatitis: Secondary | ICD-10-CM | POA: Diagnosis not present

## 2019-07-26 DIAGNOSIS — D225 Melanocytic nevi of trunk: Secondary | ICD-10-CM | POA: Diagnosis not present

## 2019-07-28 DIAGNOSIS — H26492 Other secondary cataract, left eye: Secondary | ICD-10-CM | POA: Diagnosis not present

## 2019-08-07 ENCOUNTER — Other Ambulatory Visit: Payer: Self-pay | Admitting: Cardiology

## 2019-10-20 ENCOUNTER — Other Ambulatory Visit: Payer: Self-pay | Admitting: Cardiology

## 2019-11-02 DIAGNOSIS — Z23 Encounter for immunization: Secondary | ICD-10-CM | POA: Diagnosis not present

## 2019-11-14 DIAGNOSIS — Z91013 Allergy to seafood: Secondary | ICD-10-CM | POA: Diagnosis not present

## 2019-11-14 DIAGNOSIS — K581 Irritable bowel syndrome with constipation: Secondary | ICD-10-CM | POA: Diagnosis not present

## 2019-11-14 DIAGNOSIS — K219 Gastro-esophageal reflux disease without esophagitis: Secondary | ICD-10-CM | POA: Diagnosis not present

## 2019-11-14 DIAGNOSIS — B9689 Other specified bacterial agents as the cause of diseases classified elsewhere: Secondary | ICD-10-CM | POA: Diagnosis not present

## 2019-11-14 DIAGNOSIS — J4520 Mild intermittent asthma, uncomplicated: Secondary | ICD-10-CM | POA: Diagnosis not present

## 2019-11-14 DIAGNOSIS — M8580 Other specified disorders of bone density and structure, unspecified site: Secondary | ICD-10-CM | POA: Diagnosis not present

## 2019-11-14 DIAGNOSIS — Z8669 Personal history of other diseases of the nervous system and sense organs: Secondary | ICD-10-CM | POA: Diagnosis not present

## 2019-11-14 DIAGNOSIS — N760 Acute vaginitis: Secondary | ICD-10-CM | POA: Diagnosis not present

## 2019-11-14 DIAGNOSIS — K635 Polyp of colon: Secondary | ICD-10-CM | POA: Diagnosis not present

## 2019-11-14 DIAGNOSIS — R0982 Postnasal drip: Secondary | ICD-10-CM | POA: Diagnosis not present

## 2019-11-14 DIAGNOSIS — M1711 Unilateral primary osteoarthritis, right knee: Secondary | ICD-10-CM | POA: Diagnosis not present

## 2019-11-14 DIAGNOSIS — Z780 Asymptomatic menopausal state: Secondary | ICD-10-CM | POA: Diagnosis not present

## 2019-11-14 DIAGNOSIS — I493 Ventricular premature depolarization: Secondary | ICD-10-CM | POA: Diagnosis not present

## 2019-11-24 DIAGNOSIS — R300 Dysuria: Secondary | ICD-10-CM | POA: Diagnosis not present

## 2019-11-24 DIAGNOSIS — N949 Unspecified condition associated with female genital organs and menstrual cycle: Secondary | ICD-10-CM | POA: Diagnosis not present

## 2019-11-24 DIAGNOSIS — N760 Acute vaginitis: Secondary | ICD-10-CM | POA: Diagnosis not present

## 2019-12-05 DIAGNOSIS — N898 Other specified noninflammatory disorders of vagina: Secondary | ICD-10-CM | POA: Diagnosis not present

## 2019-12-05 DIAGNOSIS — N9489 Other specified conditions associated with female genital organs and menstrual cycle: Secondary | ICD-10-CM | POA: Diagnosis not present

## 2019-12-05 DIAGNOSIS — N762 Acute vulvitis: Secondary | ICD-10-CM | POA: Diagnosis not present

## 2019-12-06 DIAGNOSIS — H1032 Unspecified acute conjunctivitis, left eye: Secondary | ICD-10-CM | POA: Diagnosis not present

## 2019-12-16 DIAGNOSIS — N898 Other specified noninflammatory disorders of vagina: Secondary | ICD-10-CM | POA: Diagnosis not present

## 2019-12-16 DIAGNOSIS — J4520 Mild intermittent asthma, uncomplicated: Secondary | ICD-10-CM | POA: Diagnosis not present

## 2019-12-22 DIAGNOSIS — R102 Pelvic and perineal pain: Secondary | ICD-10-CM | POA: Diagnosis not present

## 2020-01-26 DIAGNOSIS — R102 Pelvic and perineal pain: Secondary | ICD-10-CM | POA: Diagnosis not present

## 2020-01-26 DIAGNOSIS — N898 Other specified noninflammatory disorders of vagina: Secondary | ICD-10-CM | POA: Diagnosis not present

## 2020-01-26 DIAGNOSIS — L539 Erythematous condition, unspecified: Secondary | ICD-10-CM | POA: Diagnosis not present
